# Patient Record
Sex: Female | Born: 1990
Health system: Southern US, Community
[De-identification: ages and names within clinical notes are randomized; demographics above are authoritative.]

## PROBLEM LIST (undated history)

## (undated) ENCOUNTER — Inpatient Hospital Stay (HOSPITAL_COMMUNITY): Payer: Self-pay

## (undated) DIAGNOSIS — Z789 Other specified health status: Secondary | ICD-10-CM

## (undated) HISTORY — PX: INDUCED ABORTION: SHX677

---
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
## 2010-01-16 ENCOUNTER — Emergency Department (HOSPITAL_COMMUNITY): Admission: EM | Admit: 2010-01-16 | Discharge: 2010-01-16 | Payer: Self-pay | Admitting: Emergency Medicine

## 2010-11-15 ENCOUNTER — Emergency Department (HOSPITAL_COMMUNITY)
Admission: EM | Admit: 2010-11-15 | Discharge: 2010-11-16 | Payer: Self-pay | Source: Home / Self Care | Admitting: Emergency Medicine

## 2010-11-30 ENCOUNTER — Emergency Department (HOSPITAL_COMMUNITY)
Admission: EM | Admit: 2010-11-30 | Discharge: 2010-11-30 | Payer: Self-pay | Source: Home / Self Care | Admitting: Emergency Medicine

## 2011-01-31 LAB — URINALYSIS, ROUTINE W REFLEX MICROSCOPIC
Nitrite: NEGATIVE
Specific Gravity, Urine: 1.012 (ref 1.005–1.030)
Urobilinogen, UA: 0.2 mg/dL (ref 0.0–1.0)
pH: 7.5 (ref 5.0–8.0)

## 2011-01-31 LAB — GC/CHLAMYDIA PROBE AMP, GENITAL
Chlamydia, DNA Probe: POSITIVE — AB
GC Probe Amp, Genital: NEGATIVE

## 2011-01-31 LAB — URINE MICROSCOPIC-ADD ON

## 2011-01-31 LAB — POCT I-STAT, CHEM 8
BUN: 13 mg/dL (ref 6–23)
Chloride: 103 mEq/L (ref 96–112)
HCT: 40 % (ref 36.0–46.0)
Sodium: 137 mEq/L (ref 135–145)
TCO2: 28 mmol/L (ref 0–100)

## 2011-01-31 LAB — WET PREP, GENITAL
Clue Cells Wet Prep HPF POC: NONE SEEN
Trich, Wet Prep: NONE SEEN

## 2011-01-31 LAB — URINE CULTURE

## 2011-01-31 LAB — TSH: TSH: 0.466 u[IU]/mL (ref 0.350–4.500)

## 2011-02-12 LAB — URINALYSIS, ROUTINE W REFLEX MICROSCOPIC
Glucose, UA: NEGATIVE mg/dL
Ketones, ur: NEGATIVE mg/dL
Nitrite: POSITIVE — AB
Specific Gravity, Urine: 1.017 (ref 1.005–1.030)
pH: 7 (ref 5.0–8.0)

## 2011-02-12 LAB — URINE MICROSCOPIC-ADD ON

## 2011-02-12 LAB — URINE CULTURE

## 2011-03-30 ENCOUNTER — Emergency Department (HOSPITAL_COMMUNITY)
Admission: EM | Admit: 2011-03-30 | Discharge: 2011-03-30 | Disposition: A | Discharge status: Home / Self Care | Payer: Self-pay | Attending: Emergency Medicine | Admitting: Emergency Medicine

## 2011-03-30 DIAGNOSIS — R197 Diarrhea, unspecified: Secondary | ICD-10-CM | POA: Insufficient documentation

## 2011-03-30 DIAGNOSIS — R112 Nausea with vomiting, unspecified: Secondary | ICD-10-CM | POA: Insufficient documentation

## 2011-03-30 LAB — URINALYSIS, ROUTINE W REFLEX MICROSCOPIC
Bilirubin Urine: NEGATIVE
Nitrite: NEGATIVE
Urobilinogen, UA: 1 mg/dL (ref 0.0–1.0)

## 2011-03-31 LAB — URINE CULTURE: Culture  Setup Time: 201205230113

## 2011-08-10 LAB — COMPREHENSIVE METABOLIC PANEL
ALT: 12
Albumin: 3.8
Alkaline Phosphatase: 80
BUN: 7
Chloride: 104
Glucose, Bld: 91
Potassium: 3.8
Sodium: 138
Total Bilirubin: 0.4
Total Protein: 7

## 2011-08-10 LAB — GC/CHLAMYDIA PROBE AMP, URINE
Chlamydia, Swab/Urine, PCR: POSITIVE — AB
GC Probe Amp, Urine: NEGATIVE

## 2011-08-10 LAB — URINE CULTURE

## 2011-08-10 LAB — TYPE AND SCREEN

## 2011-08-10 LAB — ABO/RH: ABO/RH(D): B POS

## 2011-08-10 LAB — DIFFERENTIAL
Basophils Absolute: 0
Basophils Relative: 1
Eosinophils Absolute: 0
Monocytes Absolute: 0.5
Monocytes Relative: 9
Neutrophils Relative %: 72 — ABNORMAL HIGH

## 2011-08-10 LAB — URINALYSIS, ROUTINE W REFLEX MICROSCOPIC
Glucose, UA: NEGATIVE
Nitrite: NEGATIVE
pH: 6

## 2011-08-10 LAB — CBC
HCT: 44
Hemoglobin: 14.8
RDW: 11.8
WBC: 5.1

## 2011-08-10 LAB — POCT RAPID STREP A: Streptococcus, Group A Screen (Direct): NEGATIVE

## 2011-08-10 LAB — WET PREP, GENITAL: WBC, Wet Prep HPF POC: NONE SEEN

## 2011-08-10 LAB — RAPID STREP SCREEN (MED CTR MEBANE ONLY): Streptococcus, Group A Screen (Direct): NEGATIVE

## 2011-08-10 LAB — GC/CHLAMYDIA PROBE AMP, GENITAL: Chlamydia, DNA Probe: POSITIVE — AB

## 2011-08-13 LAB — DIFFERENTIAL
Basophils Absolute: 0
Basophils Relative: 0
Eosinophils Absolute: 0
Eosinophils Relative: 0
Lymphocytes Relative: 7 — ABNORMAL LOW
Lymphs Abs: 0.6 — ABNORMAL LOW
Monocytes Absolute: 0 — ABNORMAL LOW
Monocytes Relative: 0 — ABNORMAL LOW
Neutro Abs: 8
Neutrophils Relative %: 93 — ABNORMAL HIGH

## 2011-08-13 LAB — COMPREHENSIVE METABOLIC PANEL WITH GFR
ALT: 14
AST: 26
CO2: 24
Chloride: 108
Creatinine, Ser: 0.84
Total Bilirubin: 0.9

## 2011-08-13 LAB — COMPREHENSIVE METABOLIC PANEL
Albumin: 4.3
Alkaline Phosphatase: 67
BUN: 9
Calcium: 9.7
Glucose, Bld: 101 — ABNORMAL HIGH
Potassium: 3.5
Sodium: 141
Total Protein: 8

## 2011-08-13 LAB — CBC
HCT: 40.7
Hemoglobin: 14.3
MCHC: 35.1
MCV: 86.9
Platelets: 315
RBC: 4.69
RDW: 12.5
WBC: 8.6

## 2011-08-13 LAB — PREGNANCY, URINE: Preg Test, Ur: NEGATIVE

## 2011-08-13 LAB — URINALYSIS, ROUTINE W REFLEX MICROSCOPIC
Bilirubin Urine: NEGATIVE
Glucose, UA: NEGATIVE
Hgb urine dipstick: NEGATIVE
Ketones, ur: 40 — AB
Leukocytes, UA: NEGATIVE
Nitrite: NEGATIVE
Protein, ur: 30 — AB
Specific Gravity, Urine: 1.034 — ABNORMAL HIGH
Urobilinogen, UA: 0.2
pH: 7.5

## 2011-08-13 LAB — LIPASE, BLOOD: Lipase: 70 — ABNORMAL HIGH

## 2011-08-13 LAB — URINE MICROSCOPIC-ADD ON

## 2011-11-18 ENCOUNTER — Encounter (HOSPITAL_COMMUNITY): Payer: Self-pay | Admitting: Emergency Medicine

## 2011-11-18 ENCOUNTER — Inpatient Hospital Stay (HOSPITAL_COMMUNITY)
Admission: AD | Admit: 2011-11-18 | Discharge: 2011-11-18 | Disposition: A | Discharge status: Home / Self Care | Payer: Medicaid Other | Source: Ambulatory Visit | Attending: Obstetrics & Gynecology | Admitting: Obstetrics & Gynecology

## 2011-11-18 ENCOUNTER — Encounter (HOSPITAL_COMMUNITY): Payer: Self-pay | Admitting: *Deleted

## 2011-11-18 ENCOUNTER — Inpatient Hospital Stay (HOSPITAL_COMMUNITY): Payer: Medicaid Other

## 2011-11-18 ENCOUNTER — Emergency Department (INDEPENDENT_AMBULATORY_CARE_PROVIDER_SITE_OTHER)
Admission: EM | Admit: 2011-11-18 | Discharge: 2011-11-18 | Disposition: A | Discharge status: Home / Self Care | Payer: Medicaid Other | Source: Home / Self Care | Attending: Family Medicine | Admitting: Family Medicine

## 2011-11-18 DIAGNOSIS — B009 Herpesviral infection, unspecified: Secondary | ICD-10-CM

## 2011-11-18 DIAGNOSIS — R109 Unspecified abdominal pain: Secondary | ICD-10-CM

## 2011-11-18 DIAGNOSIS — A609 Anogenital herpesviral infection, unspecified: Secondary | ICD-10-CM

## 2011-11-18 DIAGNOSIS — A6000 Herpesviral infection of urogenital system, unspecified: Secondary | ICD-10-CM | POA: Insufficient documentation

## 2011-11-18 HISTORY — DX: Other specified health status: Z78.9

## 2011-11-18 LAB — POCT URINALYSIS DIP (DEVICE)
Glucose, UA: NEGATIVE mg/dL
Ketones, ur: 15 mg/dL — AB
Specific Gravity, Urine: >=1.03 (ref 1.005–1.030)
pH: 6 (ref 5.0–8.0)

## 2011-11-18 LAB — CBC
HCT: 37.5 % (ref 36.0–46.0)
Hemoglobin: 13.2 g/dL (ref 12.0–15.0)
MCH: 31.8 pg (ref 26.0–34.0)
MCV: 90.4 fL (ref 78.0–100.0)
Platelets: 237 10*3/uL (ref 150–400)
RBC: 4.15 MIL/uL (ref 3.87–5.11)
WBC: 7.1 10*3/uL (ref 4.0–10.5)

## 2011-11-18 LAB — WET PREP, GENITAL

## 2011-11-18 MED ORDER — ACYCLOVIR 400 MG PO TABS: 400.0000 mg | ORAL_TABLET | Freq: Three times a day (TID) | ORAL | Status: AC

## 2011-11-18 MED ORDER — ACYCLOVIR 400 MG PO TABS: 400.0000 mg | ORAL_TABLET | Freq: Two times a day (BID) | ORAL | Status: AC

## 2011-11-18 NOTE — Progress Notes (Signed)
PT SAYS  SHE WAS PREG  FROM OCT- DEC-  HAD TAB  ON 10-27-2011-  [facility] IN [place].  2-3 DAYS AFTER- WAS WALKING IN MALL IN [place]- SOMETHING CAME OUT- SHE LOOKED ON COMPUTER- SAYS FETUS-   SHE SAW EYES AND THINKS HEAD.   AFTER- CALLED [facility]   FOLLOW-UP IS DUE 11-19-2010- BUT PLANS TO GO HERE AT [facility].- SHE LIVES HERE.   TODAY AT 4PM WENT TO URGENT CARE- COLLECT URINE- SAYS PREG.  - TOLD HER TO COME HERE. HAS PAD ON NOW- BROWN D/C-- PT   SAYS IT HAS ODOR.- STARTED Sunday.

## 2011-11-18 NOTE — Progress Notes (Signed)
LABS DRAWN

## 2011-11-18 NOTE — ED Provider Notes (Signed)
History     CSN: [id number]  Arrival date & time 11/18/11  1639   First MD Initiated Contact with Patient 11/18/11 1843      Chief Complaint  Patient presents with  . Abdominal Cramping    (Consider location/radiation/quality/duration/timing/severity/associated sxs/prior treatment) Patient is a 21 y.o. female presenting with cramps. The history is provided by the patient. No language interpreter was used.  Abdominal Cramping The primary symptoms of the illness include abdominal pain. The current episode started more than 2 days ago. The onset of the illness was gradual. The problem has been gradually worsening.  The patient states that she believes she is currently pregnant. Additional symptoms associated with the illness include back pain. Significant associated medical issues do not include diabetes.  Pt had an abortion on 12/19.  Pt reports she has continued to have increasing pain.  Pt is worried that she has infection.  History reviewed. No pertinent past medical history.  History reviewed. No pertinent past surgical history.  No family history on file.  History  Substance Use Topics  . Smoking status: Current Everyday Smoker -- 0.5 packs/day    Types: Cigarettes  . Smokeless tobacco: Not on file  . Alcohol Use: No    OB History    Grav Para Term Preterm Abortions TAB SAB Ect Mult Living                  Review of Systems  Gastrointestinal: Positive for abdominal pain.  Musculoskeletal: Positive for back pain.  All other systems reviewed and are negative.    Allergies  Review of patient's allergies indicates no known allergies.  Home Medications  No current outpatient prescriptions on file.  BP 118/60  Pulse 70  Temp(Src) 98.6 F (37 C) (Oral)  Resp 16  SpO2 100%  LMP 11/18/2011  Physical Exam  Vitals reviewed. Constitutional: She is oriented to person, place, and time. She appears well-developed and well-nourished.  HENT:  Head: Normocephalic  and atraumatic.  Right Ear: External ear normal.  Left Ear: External ear normal.  Nose: Nose normal.  Mouth/Throat: Oropharynx is clear and moist.  Eyes: Conjunctivae are normal. Pupils are equal, round, and reactive to light.  Neck: Normal range of motion. Neck supple.  Cardiovascular: Normal rate.   Abdominal: Soft.  Musculoskeletal: Normal range of motion.  Neurological: She is alert and oriented to person, place, and time.  Skin: Skin is warm.  Psychiatric: She has a normal mood and affect.    ED Course  Procedures (including critical care time)  Labs Reviewed  POCT URINALYSIS DIP (DEVICE) - Abnormal; Notable for the following:    Bilirubin Urine SMALL (*)    Ketones, ur 15 (*)    Hgb urine dipstick MODERATE (*)    Protein, ur 30 (*)    Leukocytes, UA TRACE (*) Biochemical Testing Only. Please order routine urinalysis from main lab if confirmatory testing is needed.   All other components within normal limits  POCT PREGNANCY, URINE  POCT URINALYSIS DIPSTICK  POCT PREGNANCY, URINE   No results found.   No diagnosis found.    MDM  Preg test is positive.   I counseled pt.  [facility]  Pt needs Quant and Ultrasound.        Langston Masker, [place] 11/18/11 1907

## 2011-11-18 NOTE — ED Notes (Signed)
Pt c/o severe cramps constantly since her abortion on 12/19. She says she passed something several days after the procedure, but the pain has not gotten any better. The pain is 10/10 and unrelenting. Pt also would like STD and HIV testing.

## 2011-11-18 NOTE — Progress Notes (Signed)
TOOK IBUPROFEN 4OO MG AT 4PM

## 2011-11-18 NOTE — ED Provider Notes (Signed)
History     No chief complaint on file.  HPI 21 y.o. G1P0010 s/p TAB on 12/19 at [facility] in [place]. Ongoing abdominal pain since about 1 week after TAB, no bleeding now, only brown d/c, + odor. Requesting testing for infections. Was seen at Urgent Care today, told to come here for u/s and bloodwork. Also c/o irritation in vaginal area - thinks it may be from wearing a pad.    Past Medical History  Diagnosis Date  . No pertinent past medical history     Past Surgical History  Procedure Date  . Induced abortion     Family History  Problem Relation Age of Onset  . Cancer Mother   . Hypertension Father     History  Substance Use Topics  . Smoking status: Current Everyday Smoker -- 0.5 packs/day    Types: Cigarettes  . Smokeless tobacco: Not on file  . Alcohol Use: No    Allergies: No Known Allergies  No prescriptions prior to admission    Review of Systems  Constitutional: Negative.   Respiratory: Negative.   Cardiovascular: Negative.   Gastrointestinal: Positive for abdominal pain. Negative for nausea, vomiting, diarrhea and constipation.  Genitourinary: Negative for dysuria, urgency, frequency, hematuria and flank pain.       Positive for discharge and irritation   Musculoskeletal: Negative.   Neurological: Negative.   Psychiatric/Behavioral: Negative.    Physical Exam   Blood pressure 114/57, pulse 84, temperature 99.3 F (37.4 C), resp. rate 20, height 5\' 3"  (1.6 m), weight 112 lb 2 oz (50.86 kg), last menstrual period 11/18/2011.  Physical Exam  Nursing note and vitals reviewed. Constitutional: She is oriented to person, place, and time. She appears well-developed and well-nourished. No distress.  HENT:  Head: Normocephalic and atraumatic.  Cardiovascular: Normal rate, regular rhythm and normal heart sounds.   Respiratory: Effort normal and breath sounds normal. No respiratory distress.  GI: Soft. Bowel sounds are normal. She exhibits no  distension and no mass. There is no tenderness. There is no rebound and no guarding.  Genitourinary:    There is lesion on the right labia. There is no rash on the right labia. There is lesion on the left labia. There is no rash on the left labia. Uterus is not deviated, not enlarged, not fixed and not tender. Cervix exhibits no motion tenderness, no discharge and no friability. Right adnexum displays no mass, no tenderness and no fullness. Left adnexum displays no mass, no tenderness and no fullness. No erythema, tenderness or bleeding around the vagina. Vaginal discharge (white) found.  Lymphadenopathy:       Right: Inguinal adenopathy present.       Left: Inguinal adenopathy present.  Neurological: She is alert and oriented to person, place, and time.  Skin: Skin is warm and dry.  Psychiatric: She has a normal mood and affect.    MAU Course  Procedures  Results for orders placed during the hospital encounter of 11/18/11 (from the past 24 hour(s))  CBC     Status: Normal   Collection Time   11/18/11  8:30 PM      Component Value Range   WBC 7.1  4.0 - 10.5 (K/uL)   RBC 4.15  3.87 - 5.11 (MIL/uL)   Hemoglobin 13.2  12.0 - 15.0 (g/dL)   HCT 16.1  09.6 - 04.5 (%)   MCV 90.4  78.0 - 100.0 (fL)   MCH 31.8  26.0 - 34.0 (pg)   MCHC 35.2  30.0 - 36.0 (g/dL)   RDW 16.1  09.6 - 04.5 (%)   Platelets 237  150 - 400 (K/uL)  ABO/RH     Status: Normal   Collection Time   11/18/11  8:30 PM      Component Value Range   ABO/RH(D) B POS    HCG, QUANTITATIVE, PREGNANCY     Status: Abnormal   Collection Time   11/18/11  8:30 PM      Component Value Range   hCG, Beta Chain, Quant, S 59 (*) <5 (mIU/mL)  WET PREP, GENITAL     Status: Abnormal   Collection Time   11/18/11 10:30 PM      Component Value Range   Yeast, Wet Prep NONE SEEN  NONE SEEN    Trich, Wet Prep NONE SEEN  NONE SEEN    Clue Cells, Wet Prep NONE SEEN  NONE SEEN    WBC, Wet Prep HPF POC FEW (*) NONE SEEN    US Ob Comp Less 14  Wks  11/18/2011  *RADIOLOGY REPORT*  Clinical Data: Pelvic pain.  Patient status post surgical abortion on 10/27/2011.  OBSTETRIC <14 WK ULTRASOUND  Technique:  Transabdominal ultrasound was performed for evaluation of the gestation as well as the maternal uterus and adnexal regions.  Comparison:  None.  Intrauterine gestational sac: None Yolk sac: None Embryo: Not present  Maternal uterus/Adnexae: The endometrium is indistinct through the uterine fundus. Myometrium of the uterine fundus is heterogeneous.  The left and right ovary appear normal.  No free fluid.  IMPRESSION:   Indistinct endometrium and heterogeneous myometrium within the uterine fundus following abortion.  Differential includes postsurgical change versus hematoma versus retained products of conception.  Original Report Authenticated By: Genevive Bi, M.D.   US Ob Transvaginal  11/18/2011  *RADIOLOGY REPORT*  Clinical Data: Pelvic pain.  Patient status post surgical abortion on 10/27/2011.  OBSTETRIC <14 WK ULTRASOUND  Technique:  Transabdominal ultrasound was performed for evaluation of the gestation as well as the maternal uterus and adnexal regions.  Comparison:  None.  Intrauterine gestational sac: None Yolk sac: None Embryo: Not present  Maternal uterus/Adnexae: The endometrium is indistinct through the uterine fundus. Myometrium of the uterine fundus is heterogeneous.  The left and right ovary appear normal.  No free fluid.  IMPRESSION:   Indistinct endometrium and heterogeneous myometrium within the uterine fundus following abortion.  Differential includes postsurgical change versus hematoma versus retained products of conception.  Original Report Authenticated By: Genevive Bi, M.D.   HSV and GC/CT pending Assessment and Plan  21 y.o. G1P0010 s/p TAB with abdominal pain - f/u in 48 hours for repeat quant Likely HSV outbreak - rx Acyclovir 400 tid x 7 days, then 400 bid for prophylaxis if desired/needed [facility] as [facility] - may have further STD testing there  Keylin Ferryman 11/18/2011, 9:33 PM

## 2011-11-19 LAB — HERPES SIMPLEX VIRUS(HSV) DNA BY PCR
HSV 2 DNA: DETECTED
Specimen source hsv: NO GROWTH

## 2011-11-19 NOTE — ED Provider Notes (Signed)
Medical screening examination/treatment/procedure(s) were performed by non-physician practitioner and as supervising physician I was immediately available for consultation/collaboration.  Corrie Mckusick, MD 11/19/11 1016

## 2011-11-21 ENCOUNTER — Inpatient Hospital Stay (HOSPITAL_COMMUNITY)
Admission: AD | Admit: 2011-11-21 | Discharge: 2011-11-21 | Disposition: A | Discharge status: Home / Self Care | Payer: Medicaid Other | Source: Ambulatory Visit | Attending: Obstetrics and Gynecology | Admitting: Obstetrics and Gynecology

## 2011-11-21 DIAGNOSIS — Z09 Encounter for follow-up examination after completed treatment for conditions other than malignant neoplasm: Secondary | ICD-10-CM | POA: Insufficient documentation

## 2011-11-21 LAB — HCG, QUANTITATIVE, PREGNANCY: hCG, Beta Chain, Quant, S: 33 m[IU]/mL — ABNORMAL HIGH (ref <5)

## 2011-11-21 NOTE — ED Provider Notes (Signed)
Chief Complaint:  Labs Only   Ashley Stout is  21 y.o. G1P0010.  Patient's last menstrual period was 11/18/2011.Marland Kitchen  Her pregnancy status is unknown.  She presents complaining of Labs Only She was seen in MAU 2 days ago, following a TAB and had a QHCG of 56 and u/s showed possible retained products.  Bleeding is minimal now. She was also newly dx with HSV2 with + culture OB History    Grav Para Term Preterm Abortions TAB SAB Ect Mult Living   1    1 1            Past Medical History  Diagnosis Date  . No pertinent past medical history     Past Surgical History  Procedure Date  . Induced abortion     Family History  Problem Relation Age of Onset  . Cancer Mother   . Hypertension Father     History  Substance Use Topics  . Smoking status: Current Everyday Smoker -- 0.5 packs/day    Types: Cigarettes  . Smokeless tobacco: Not on file  . Alcohol Use: No    Allergies: No Known Allergies  Prescriptions prior to admission  Medication Sig Dispense Refill  . acyclovir (ZOVIRAX) 400 MG tablet Take 1 tablet (400 mg total) by mouth 3 (three) times daily.  21 tablet  0  . acyclovir (ZOVIRAX) 400 MG tablet Take 1 tablet (400 mg total) by mouth 2 (two) times daily.  60 tablet  11  . OVER THE COUNTER MEDICATION Take 1 tablet by mouth daily. Vitafusion Prenatal Gummy        Review of Systems - Negative except aforementioned complaints  Physical Exam  Not needed Last menstrual period 11/18/2011.  Labs: No results found for this or any previous visit (from the past 24 hour(s)). Imaging Studies:  US Ob Comp Less 14 Wks  11/18/2011  *RADIOLOGY REPORT*  Clinical Data: Pelvic pain.  Patient status post surgical abortion on 10/27/2011.  OBSTETRIC <14 WK ULTRASOUND  Technique:  Transabdominal ultrasound was performed for evaluation of the gestation as well as the maternal uterus and adnexal regions.  Comparison:  None.  Intrauterine gestational sac: None Yolk sac: None Embryo: Not present   Maternal uterus/Adnexae: The endometrium is indistinct through the uterine fundus. Myometrium of the uterine fundus is heterogeneous.  The left and right ovary appear normal.  No free fluid.  IMPRESSION:   Indistinct endometrium and heterogeneous myometrium within the uterine fundus following abortion.  Differential includes postsurgical change versus hematoma versus retained products of conception.  Original Report Authenticated By: Genevive Bi, M.D.   US Ob Transvaginal  11/18/2011  *RADIOLOGY REPORT*  Clinical Data: Pelvic pain.  Patient status post surgical abortion on 10/27/2011.  OBSTETRIC <14 WK ULTRASOUND  Technique:  Transabdominal ultrasound was performed for evaluation of the gestation as well as the maternal uterus and adnexal regions.  Comparison:  None.  Intrauterine gestational sac: None Yolk sac: None Embryo: Not present  Maternal uterus/Adnexae: The endometrium is indistinct through the uterine fundus. Myometrium of the uterine fundus is heterogeneous.  The left and right ovary appear normal.  No free fluid.  IMPRESSION:   Indistinct endometrium and heterogeneous myometrium within the uterine fundus following abortion.  Differential includes postsurgical change versus hematoma versus retained products of conception.  Original Report Authenticated By: Genevive Bi, M.D.     Assessment: Will repeat qhcg.  Machine is not working here so labs are being sent to [facility].    Plan: Call pt  with results  CRESENZO-DISHMAN,Tavia Stave

## 2011-11-21 NOTE — ED Notes (Signed)
Ashley Stout,CNM discussing HSV dx with pt. Pt upset educating pt on treatment and prevention. Pt to go home and she will call her with results of BHCG. Pt agreeable to plan.

## 2011-11-21 NOTE — Progress Notes (Signed)
Pt had TAB and quant was 56. Here for  Repeat BHCG. Pt having some cramping that is getting better.

## 2011-11-21 NOTE — ED Notes (Signed)
Attempted to reach pt by phone to inform her that she does not need to f/u any longer in MAU (per [place], CNM).  Left message on number listed in chart, called mother's home who told me the pt's cell is [phone], not the number we have listed.  No answer at that number.

## 2011-11-22 ENCOUNTER — Telehealth (HOSPITAL_COMMUNITY): Payer: Self-pay | Admitting: Nurse Practitioner

## 2011-11-22 NOTE — Telephone Encounter (Signed)
Patient had TAB 10/27/11. Went to Urgent Care 11/19/11 for vaginal pain and dx with HSV, also had pos pregnancy test at that time. [facility] and Bhcg was 59. Returned 12/09/11 and the Bhcg ha dropped to 33. Patient instructed to return in 48 hours. Call today stating that she feels fine and since the numbers are dropping she will follow up with her PCP at [facility]. Discussed with patient if she has heavy bleeding, pain, feeling weak and dizzy or other problems to return immediately. Patient voices understanding.

## 2011-11-22 NOTE — ED Provider Notes (Signed)
Agree with above note.  Ashley Nickson H. 11/22/2011 12:32 PM  

## 2011-11-24 NOTE — ED Provider Notes (Signed)
Attestation of Attending Supervision of Advanced Practitioner: Evaluation and management procedures were performed by the PA/NP/CNM/OB Fellow under my supervision/collaboration. Chart reviewed and agree with management and plan.  Delvina Mizzell V 11/24/2011 11:57 AM

## 2013-01-12 ENCOUNTER — Emergency Department (HOSPITAL_COMMUNITY)
Admission: EM | Admit: 2013-01-12 | Discharge: 2013-01-12 | Disposition: A | Discharge status: Home / Self Care | Payer: BC Managed Care – PPO | Attending: Emergency Medicine | Admitting: Emergency Medicine

## 2013-01-12 ENCOUNTER — Encounter (HOSPITAL_COMMUNITY): Payer: Self-pay | Admitting: Emergency Medicine

## 2013-01-12 DIAGNOSIS — Z3202 Encounter for pregnancy test, result negative: Secondary | ICD-10-CM | POA: Insufficient documentation

## 2013-01-12 DIAGNOSIS — R634 Abnormal weight loss: Secondary | ICD-10-CM | POA: Insufficient documentation

## 2013-01-12 DIAGNOSIS — M2550 Pain in unspecified joint: Secondary | ICD-10-CM | POA: Insufficient documentation

## 2013-01-12 DIAGNOSIS — F17200 Nicotine dependence, unspecified, uncomplicated: Secondary | ICD-10-CM | POA: Insufficient documentation

## 2013-01-12 DIAGNOSIS — R112 Nausea with vomiting, unspecified: Secondary | ICD-10-CM

## 2013-01-12 LAB — CBC WITH DIFFERENTIAL/PLATELET
Basophils Absolute: 0 10*3/uL (ref 0.0–0.1)
Basophils Relative: 1 % (ref 0–1)
Eosinophils Absolute: 0 10*3/uL (ref 0.0–0.7)
MCH: 29 pg (ref 26.0–34.0)
MCHC: 34.6 g/dL (ref 30.0–36.0)
Monocytes Absolute: 0.3 10*3/uL (ref 0.1–1.0)
Neutro Abs: 2.9 10*3/uL (ref 1.7–7.7)
Neutrophils Relative %: 64 % (ref 43–77)
RDW: 13.6 % (ref 11.5–15.5)

## 2013-01-12 LAB — COMPREHENSIVE METABOLIC PANEL
AST: 15 U/L (ref 0–37)
Albumin: 4.2 g/dL (ref 3.5–5.2)
Chloride: 103 mEq/L (ref 96–112)
Creatinine, Ser: 0.82 mg/dL (ref 0.50–1.10)
Potassium: 3.7 mEq/L (ref 3.5–5.1)
Total Bilirubin: 0.5 mg/dL (ref 0.3–1.2)
Total Protein: 8.1 g/dL (ref 6.0–8.3)

## 2013-01-12 LAB — URINE MICROSCOPIC-ADD ON

## 2013-01-12 LAB — URINALYSIS, ROUTINE W REFLEX MICROSCOPIC
Glucose, UA: NEGATIVE mg/dL
Leukocytes, UA: NEGATIVE
Nitrite: NEGATIVE
Specific Gravity, Urine: 1.016 (ref 1.005–1.030)
pH: 6 (ref 5.0–8.0)

## 2013-01-12 LAB — PREGNANCY, URINE: Preg Test, Ur: NEGATIVE

## 2013-01-12 MED ORDER — ONDANSETRON HCL 4 MG/2ML IJ SOLN
4.0000 mg | Freq: Once | INTRAMUSCULAR | Status: AC
  Administered 2013-01-12: 4 mg via INTRAVENOUS
  Filled 2013-01-12: qty 2

## 2013-01-12 MED ORDER — SODIUM CHLORIDE 0.9 % IV BOLUS (SEPSIS)
1000.0000 mL | Freq: Once | INTRAVENOUS | Status: AC
  Administered 2013-01-12: 1000 mL via INTRAVENOUS

## 2013-01-12 MED ORDER — ACETAMINOPHEN 325 MG PO TABS: 650.0000 mg | ORAL_TABLET | Freq: Once | ORAL | Status: DC

## 2013-01-12 NOTE — ED Provider Notes (Signed)
History     CSN: [id number]  Arrival date & time 01/12/13  1619   First MD Initiated Contact with Patient 01/12/13 1623      Chief Complaint  Patient presents with  . Emesis  . Joint Pain    (Consider location/radiation/quality/duration/timing/severity/associated sxs/prior treatment) HPI Patient with nausea for two months worse for past week with vomiting.  States has lost 20 pounds.  Joints are sore, sleeping less.  Patient denies chest pain, cough, abdominal pain, masses, dark tarry stools, history of anemia. Past Medical History  Diagnosis Date  . No pertinent past medical history     Past Surgical History  Procedure Laterality Date  . Induced abortion      Family History  Problem Relation Age of Onset  . Cancer Mother   . Hypertension Father     History  Substance Use Topics  . Smoking status: Current Every Day Smoker -- 0.50 packs/day    Types: Cigarettes  . Smokeless tobacco: Not on file  . Alcohol Use: No    OB History   Grav Para Term Preterm Abortions TAB SAB Ect Mult Living   1    1 1           Review of Systems  All other systems reviewed and are negative.    Allergies  Review of patient's allergies indicates no known allergies.  Home Medications   Current Outpatient Rx  Name  Route  Sig  Dispense  Refill  . OVER THE COUNTER MEDICATION   Oral   Take 1 tablet by mouth daily. Vitafusion Prenatal Gummy           BP 110/64  Pulse 102  Temp(Src) 98.4 F (36.9 C) (Oral)  Resp 22  SpO2 97%  Physical Exam  Nursing note and vitals reviewed. Constitutional: She appears well-developed and well-nourished.  HENT:  Head: Normocephalic and atraumatic.  Slightly tender over thyroid  Eyes: Conjunctivae and EOM are normal. Pupils are equal, round, and reactive to light.  Neck: Normal range of motion. Neck supple.  Cardiovascular: Normal rate, regular rhythm, normal heart sounds and intact distal pulses.   Pulmonary/Chest: Effort normal and  breath sounds normal.  Abdominal: Soft. Bowel sounds are normal.  Musculoskeletal: Normal range of motion.  Neurological: She is alert.  Skin: Skin is warm and dry.  Psychiatric: She has a normal mood and affect. Thought content normal.    ED Course  Procedures (including critical care time)  Labs Reviewed - No data to display No results found.   No diagnosis found.    MDM   Results for orders placed during the hospital encounter of 01/12/13  CBC WITH DIFFERENTIAL      Result Value Range   WBC 4.5  4.0 - 10.5 K/uL   RBC 4.86  3.87 - 5.11 MIL/uL   Hemoglobin 14.1  12.0 - 15.0 g/dL   HCT 40.9  81.1 - 91.4 %   MCV 84.0  78.0 - 100.0 fL   MCH 29.0  26.0 - 34.0 pg   MCHC 34.6  30.0 - 36.0 g/dL   RDW 78.2  95.6 - 21.3 %   Platelets 287  150 - 400 K/uL   Neutrophils Relative 64  43 - 77 %   Neutro Abs 2.9  1.7 - 7.7 K/uL   Lymphocytes Relative 27  12 - 46 %   Lymphs Abs 1.2  0.7 - 4.0 K/uL   Monocytes Relative 7  3 - 12 %   Monocytes  Absolute 0.3  0.1 - 1.0 K/uL   Eosinophils Relative 1  0 - 5 %   Eosinophils Absolute 0.0  0.0 - 0.7 K/uL   Basophils Relative 1  0 - 1 %   Basophils Absolute 0.0  0.0 - 0.1 K/uL  COMPREHENSIVE METABOLIC PANEL      Result Value Range   Sodium 139  135 - 145 mEq/L   Potassium 3.7  3.5 - 5.1 mEq/L   Chloride 103  96 - 112 mEq/L   CO2 24  19 - 32 mEq/L   Glucose, Bld 86  70 - 99 mg/dL   BUN 8  6 - 23 mg/dL   Creatinine, Ser 1.47  0.50 - 1.10 mg/dL   Calcium 9.3  8.4 - 82.9 mg/dL   Total Protein 8.1  6.0 - 8.3 g/dL   Albumin 4.2  3.5 - 5.2 g/dL   AST 15  0 - 37 U/L   ALT 9  0 - 35 U/L   Alkaline Phosphatase 62  39 - 117 U/L   Total Bilirubin 0.5  0.3 - 1.2 mg/dL   GFR calc non Af Amer >90  >90 mL/min   GFR calc Af Amer >90  >90 mL/min  PREGNANCY, URINE      Result Value Range   Preg Test, Ur NEGATIVE  NEGATIVE  URINALYSIS, ROUTINE W REFLEX MICROSCOPIC      Result Value Range   Color, Urine YELLOW  YELLOW   APPearance CLEAR  CLEAR    Specific Gravity, Urine 1.016  1.005 - 1.030   pH 6.0  5.0 - 8.0   Glucose, UA NEGATIVE  NEGATIVE mg/dL   Hgb urine dipstick TRACE (*) NEGATIVE   Bilirubin Urine NEGATIVE  NEGATIVE   Ketones, ur TRACE (*) NEGATIVE mg/dL   Protein, ur 30 (*) NEGATIVE mg/dL   Urobilinogen, UA 0.2  0.0 - 1.0 mg/dL   Nitrite NEGATIVE  NEGATIVE   Leukocytes, UA NEGATIVE  NEGATIVE  URINE MICROSCOPIC-ADD ON      Result Value Range   Squamous Epithelial / LPF RARE  RARE   WBC, UA 0-2  <3 WBC/hpf   RBC / HPF 0-2  <3 RBC/hpf   Patient with normal vital signs here and CBC and chemistries are normal. Thyroid screening exam was done but will not be returned tonight. Patient is advised that she should the flow manager for results. She is advised she should follow with her primary care this week.        Hilario Quarry, MD 01/12/13 [phone]

## 2013-01-12 NOTE — ED Notes (Signed)
Pt states that she has lost over 20lb in past few months.  Pt states she has been nauseous, but in past week has had emesis after every time she ate.  Pt states she has also has had diarrhea and has been sweaty.  Pt states period has also been early for 2 months.  States period started today.  Pt also states she has sore joints.  Denies fever, SOB.

## 2013-01-12 NOTE — ED Notes (Signed)
Notified pt that urine is needed

## 2013-01-15 LAB — T3: T3, Total: 73 ng/dl — ABNORMAL LOW (ref 80.0–204.0)

## 2013-01-15 LAB — TSH: TSH: 0.55 u[IU]/mL (ref 0.350–4.500)

## 2013-01-15 LAB — T4, FREE: Free T4: 1.47 ng/dL (ref 0.80–1.80)

## 2013-02-20 ENCOUNTER — Emergency Department (HOSPITAL_COMMUNITY)
Admission: EM | Admit: 2013-02-20 | Discharge: 2013-02-20 | Disposition: A | Discharge status: Home / Self Care | Payer: BC Managed Care – PPO | Attending: Emergency Medicine | Admitting: Emergency Medicine

## 2013-02-20 ENCOUNTER — Encounter (HOSPITAL_COMMUNITY): Payer: Self-pay | Admitting: Emergency Medicine

## 2013-02-20 DIAGNOSIS — B9689 Other specified bacterial agents as the cause of diseases classified elsewhere: Secondary | ICD-10-CM | POA: Insufficient documentation

## 2013-02-20 DIAGNOSIS — Z8742 Personal history of other diseases of the female genital tract: Secondary | ICD-10-CM | POA: Insufficient documentation

## 2013-02-20 DIAGNOSIS — N898 Other specified noninflammatory disorders of vagina: Secondary | ICD-10-CM | POA: Insufficient documentation

## 2013-02-20 DIAGNOSIS — F17200 Nicotine dependence, unspecified, uncomplicated: Secondary | ICD-10-CM | POA: Insufficient documentation

## 2013-02-20 DIAGNOSIS — Z3202 Encounter for pregnancy test, result negative: Secondary | ICD-10-CM | POA: Insufficient documentation

## 2013-02-20 DIAGNOSIS — N760 Acute vaginitis: Secondary | ICD-10-CM | POA: Insufficient documentation

## 2013-02-20 DIAGNOSIS — A499 Bacterial infection, unspecified: Secondary | ICD-10-CM | POA: Insufficient documentation

## 2013-02-20 DIAGNOSIS — Z8619 Personal history of other infectious and parasitic diseases: Secondary | ICD-10-CM | POA: Insufficient documentation

## 2013-02-20 DIAGNOSIS — N9489 Other specified conditions associated with female genital organs and menstrual cycle: Secondary | ICD-10-CM | POA: Insufficient documentation

## 2013-02-20 LAB — WET PREP, GENITAL: WBC, Wet Prep HPF POC: NONE SEEN

## 2013-02-20 LAB — URINALYSIS, ROUTINE W REFLEX MICROSCOPIC
Ketones, ur: NEGATIVE mg/dL
Leukocytes, UA: NEGATIVE
Nitrite: NEGATIVE
Protein, ur: 100 mg/dL — AB
Urobilinogen, UA: 0.2 mg/dL (ref 0.0–1.0)

## 2013-02-20 MED ORDER — METRONIDAZOLE 500 MG PO TABS: 500.0000 mg | ORAL_TABLET | Freq: Two times a day (BID) | ORAL | Status: DC

## 2013-02-20 NOTE — ED Provider Notes (Signed)
History     CSN: [id number]  Arrival date & time 02/20/13  1903   First MD Initiated Contact with Patient 02/20/13 1910      Chief Complaint  Patient presents with  . Exposure to STD    (Consider location/radiation/quality/duration/timing/severity/associated sxs/prior treatment) Patient is a 22 y.o. female presenting with STD exposure. The history is provided by the patient and medical records. No language interpreter was used.  Exposure to STD The current episode started more than 1 month ago. The problem occurs constantly. The problem has been gradually worsening. Pertinent negatives include no abdominal pain, arthralgias, change in bowel habit, chest pain, chills, congestion, coughing, diaphoresis, fatigue, fever, headaches, myalgias, nausea, neck pain, rash, sore throat, swollen glands, urinary symptoms, vertigo, visual change, vomiting or weakness. Nothing aggravates the symptoms. She has tried nothing for the symptoms. The treatment provided no relief.    Ashley Stout is a 22 y.o. female  with a hx of induced abortion presents to the Emergency Department complaining of gradual, persistent, progressively worsening vaginal discharge onset 2 months ago. Associated symptoms include vaginal odor.  Nothing makes it better and nothing makes it worse.  Pt denies fever, chills, headache, neck pain, chest pain, SOB, abdominal/pelvic pain, N/V/D weakness, syncope, dizziness, urinary frequency, urgency or dysuria.  Pt denies using douce or other feminine washes.  Pt is sexually active without consistent use of condom or other birth control with 1 female partner for the past year.  Pt has a Hx of chlamydia.  LMP 02/12/13 and ended several days ago.      Past Medical History  Diagnosis Date  . No pertinent past medical history     Past Surgical History  Procedure Laterality Date  . Induced abortion      Family History  Problem Relation Age of Onset  . Cancer Mother   . Hypertension Father      History  Substance Use Topics  . Smoking status: Current Every Day Smoker -- 0.50 packs/day    Types: Cigarettes  . Smokeless tobacco: Not on file  . Alcohol Use: Yes    OB History   Grav Para Term Preterm Abortions TAB SAB Ect Mult Living   1    1 1           Review of Systems  Constitutional: Negative for fever, chills, diaphoresis, appetite change, fatigue and unexpected weight change.  HENT: Negative for congestion, sore throat, neck pain and neck stiffness.   Respiratory: Negative for cough, chest tightness and shortness of breath.   Cardiovascular: Negative for chest pain.  Gastrointestinal: Negative for nausea, vomiting, abdominal pain, diarrhea, constipation, blood in stool, abdominal distention, rectal pain and change in bowel habit.  Genitourinary: Positive for vaginal discharge. Negative for dysuria, urgency, frequency, hematuria, flank pain, vaginal bleeding, difficulty urinating, vaginal pain, menstrual problem and pelvic pain.  Musculoskeletal: Negative for myalgias, back pain and arthralgias.  Skin: Negative for rash.  Neurological: Negative for dizziness, vertigo, weakness, light-headedness and headaches.  All other systems reviewed and are negative.    Allergies  Acetaminophen  Home Medications   Current Outpatient Rx  Name  Route  Sig  Dispense  Refill  . metroNIDAZOLE (FLAGYL) 500 MG tablet   Oral   Take 1 tablet (500 mg total) by mouth 2 (two) times daily. One po bid x 7 days   14 tablet   0     BP 91/63  Pulse 74  Temp(Src) 99.1 F (37.3 C) (Oral)  SpO2 100%  LMP 02/13/2013  Physical Exam  Nursing note and vitals reviewed. Constitutional: She is oriented to person, place, and time. She appears well-developed and well-nourished. No distress.  HENT:  Head: Normocephalic and atraumatic.  Mouth/Throat: Oropharynx is clear and moist.  Eyes: Conjunctivae are normal. Pupils are equal, round, and reactive to light. No scleral icterus.  Neck:  Normal range of motion. Neck supple.  Cardiovascular: Normal rate, regular rhythm, normal heart sounds and intact distal pulses.   No murmur heard. Pulmonary/Chest: Effort normal and breath sounds normal. No respiratory distress. She has no wheezes.  Abdominal: Soft. Normal appearance and bowel sounds are normal. She exhibits no mass. There is no hepatosplenomegaly. There is no tenderness. There is no rigidity, no rebound, no guarding, no CVA tenderness, no tenderness at McBurney's point and negative Murphy's sign. Hernia confirmed negative in the right inguinal area and confirmed negative in the left inguinal area.  Genitourinary: Uterus normal. Pelvic exam was performed with patient supine. There is no rash, tenderness or lesion on the right labia. There is no rash, tenderness or lesion on the left labia. Uterus is not deviated, not enlarged, not fixed and not tender. Cervix exhibits no motion tenderness, no discharge and no friability. Right adnexum displays no mass, no tenderness and no fullness. Left adnexum displays no mass, no tenderness and no fullness. No erythema, tenderness or bleeding around the vagina. No foreign body around the vagina. No signs of injury around the vagina. Vaginal discharge found.  Musculoskeletal: Normal range of motion. She exhibits no edema.  Lymphadenopathy:    She has no cervical adenopathy.       Right: No inguinal adenopathy present.       Left: No inguinal adenopathy present.  Neurological: She is alert and oriented to person, place, and time. She exhibits normal muscle tone. Coordination normal.  Speech is clear and goal oriented Moves extremities without ataxia  Skin: Skin is warm and dry. No rash noted. She is not diaphoretic.  Psychiatric: She has a normal mood and affect.    ED Course  Procedures (including critical care time)  Labs Reviewed  WET PREP, GENITAL - Abnormal; Notable for the following:    Clue Cells Wet Prep HPF POC FEW (*)    All other  components within normal limits  URINALYSIS, ROUTINE W REFLEX MICROSCOPIC - Abnormal; Notable for the following:    APPearance CLOUDY (*)    Protein, ur 100 (*)    All other components within normal limits  URINE MICROSCOPIC-ADD ON - Abnormal; Notable for the following:    Squamous Epithelial / LPF FEW (*)    Bacteria, UA FEW (*)    All other components within normal limits  GC/CHLAMYDIA PROBE AMP  POCT PREGNANCY, URINE   No results found.   1. BV (bacterial vaginosis)   2. Vaginal discharge   3. Vaginal odor       MDM  Ashley Stout presents for concerns of STD exposure, vaginal discharge and vaginal odor.  Patient to be discharged with instructions to follow up with OBGYN. Discussed importance of using protection when sexually active. Pt understands that they have GC/Chlamydia cultures pending and that they will need to inform all sexual partners if results return positive.  Pt not concerning for PID because hemodynamically stable and no cervical motion tenderness on pelvic exam. Pt has been treated with flagyl for Bacterial Vaginosis. Pt has been advised to not drink alcohol while on this medication. I have also discussed reasons to return  immediately to the ER.  Patient expresses understanding and agrees with plan.           Dahlia Client Shakiera Edelson, PA-C 02/20/13 2123

## 2013-02-20 NOTE — ED Notes (Signed)
Pt wants check for STD due to more vaginal discharge and different (foul)odor than usually.

## 2013-02-21 LAB — GC/CHLAMYDIA PROBE AMP
CT Probe RNA: NEGATIVE
GC Probe RNA: NEGATIVE

## 2013-02-21 NOTE — ED Provider Notes (Signed)
Medical screening examination/treatment/procedure(s) were performed by non-physician practitioner and as supervising physician I was immediately available for consultation/collaboration.   Nelia Shi, MD 02/21/13 1031

## 2013-02-24 ENCOUNTER — Telehealth (HOSPITAL_COMMUNITY): Payer: Self-pay | Admitting: Emergency Medicine

## 2013-03-19 ENCOUNTER — Emergency Department (HOSPITAL_COMMUNITY)
Admission: EM | Admit: 2013-03-19 | Discharge: 2013-03-19 | Disposition: A | Discharge status: Home / Self Care | Payer: BC Managed Care – PPO | Attending: Emergency Medicine | Admitting: Emergency Medicine

## 2013-03-19 ENCOUNTER — Emergency Department (HOSPITAL_COMMUNITY): Payer: BC Managed Care – PPO

## 2013-03-19 DIAGNOSIS — Z79899 Other long term (current) drug therapy: Secondary | ICD-10-CM | POA: Insufficient documentation

## 2013-03-19 DIAGNOSIS — R05 Cough: Secondary | ICD-10-CM

## 2013-03-19 DIAGNOSIS — R059 Cough, unspecified: Secondary | ICD-10-CM | POA: Insufficient documentation

## 2013-03-19 DIAGNOSIS — Z3202 Encounter for pregnancy test, result negative: Secondary | ICD-10-CM | POA: Insufficient documentation

## 2013-03-19 DIAGNOSIS — F17200 Nicotine dependence, unspecified, uncomplicated: Secondary | ICD-10-CM | POA: Insufficient documentation

## 2013-03-19 DIAGNOSIS — R5381 Other malaise: Secondary | ICD-10-CM | POA: Insufficient documentation

## 2013-03-19 DIAGNOSIS — R634 Abnormal weight loss: Secondary | ICD-10-CM | POA: Insufficient documentation

## 2013-03-19 DIAGNOSIS — R5383 Other fatigue: Secondary | ICD-10-CM | POA: Insufficient documentation

## 2013-03-19 LAB — URINALYSIS, ROUTINE W REFLEX MICROSCOPIC
Bilirubin Urine: NEGATIVE
Glucose, UA: NEGATIVE mg/dL
Ketones, ur: NEGATIVE mg/dL
Leukocytes, UA: NEGATIVE
Protein, ur: 30 mg/dL — AB

## 2013-03-19 LAB — URINE MICROSCOPIC-ADD ON

## 2013-03-19 LAB — CBC WITH DIFFERENTIAL/PLATELET
Basophils Absolute: 0 10*3/uL (ref 0.0–0.1)
Basophils Relative: 1 % (ref 0–1)
Eosinophils Relative: 3 % (ref 0–5)
HCT: 34.6 % — ABNORMAL LOW (ref 36.0–46.0)
Hemoglobin: 12 g/dL (ref 12.0–15.0)
Lymphocytes Relative: 24 % (ref 12–46)
MCHC: 34.7 g/dL (ref 30.0–36.0)
MCV: 84.6 fL (ref 78.0–100.0)
Monocytes Absolute: 0.9 10*3/uL (ref 0.1–1.0)
Monocytes Relative: 15 % — ABNORMAL HIGH (ref 3–12)
RDW: 13.9 % (ref 11.5–15.5)

## 2013-03-19 LAB — BASIC METABOLIC PANEL
BUN: 12 mg/dL (ref 6–23)
CO2: 26 mEq/L (ref 19–32)
Calcium: 9 mg/dL (ref 8.4–10.5)
Creatinine, Ser: 1.04 mg/dL (ref 0.50–1.10)

## 2013-03-19 NOTE — ED Notes (Signed)
Sheldon MD at bedside. 

## 2013-03-19 NOTE — ED Provider Notes (Signed)
History     CSN: [id number]  Arrival date & time 03/19/13  0141   First MD Initiated Contact with Patient 03/19/13 0359      Chief Complaint  Patient presents with  . Weight Loss    (Consider location/radiation/quality/duration/timing/severity/associated sxs/prior treatment) HPI Pt with no known medical problems reports 25lb weight loss for the last several months. Seen in the ED for same about 2 months ago and had negative workup including thyroid function but has not seen her PCP since then. She reports continued general weakness and weight loss. Denies any anorexia, N/V/D, dysuria or fever. She reports some nasal congestion and cough the last few days. Cough is mostly dry, no SOB. No fever.   Past Medical History  Diagnosis Date  . No pertinent past medical history     Past Surgical History  Procedure Laterality Date  . Induced abortion      Family History  Problem Relation Age of Onset  . Cancer Mother   . Hypertension Father     History  Substance Use Topics  . Smoking status: Current Every Day Smoker -- 0.50 packs/day    Types: Cigarettes  . Smokeless tobacco: Not on file  . Alcohol Use: Yes    OB History   Grav Para Term Preterm Abortions TAB SAB Ect Mult Living   1    1 1           Review of Systems All other systems reviewed and are negative except as noted in HPI.   Allergies  Acetaminophen  Home Medications   Current Outpatient Rx  Name  Route  Sig  Dispense  Refill  . Multiple Vitamin (MULTIVITAMIN WITH MINERALS) TABS   Oral   Take 1 tablet by mouth daily.           BP 116/73  Pulse 86  Temp(Src) 98.6 F (37 C) (Oral)  Resp 18  SpO2 100%  LMP 02/13/2013  Physical Exam  Nursing note and vitals reviewed. Constitutional: She is oriented to person, place, and time. She appears well-developed.  Thin  HENT:  Head: Normocephalic and atraumatic.  Eyes: EOM are normal. Pupils are equal, round, and reactive to light.  Neck: Normal  range of motion. Neck supple.  Cardiovascular: Normal rate, normal heart sounds and intact distal pulses.   Pulmonary/Chest: Effort normal and breath sounds normal.  Abdominal: Bowel sounds are normal. She exhibits no distension. There is no tenderness.  Musculoskeletal: Normal range of motion. She exhibits no edema and no tenderness.  Neurological: She is alert and oriented to person, place, and time. She has normal strength. No cranial nerve deficit or sensory deficit.  Skin: Skin is warm and dry. No rash noted.  Psychiatric: She has a normal mood and affect.    ED Course  Procedures (including critical care time)  Labs Reviewed  CBC WITH DIFFERENTIAL - Abnormal; Notable for the following:    HCT 34.6 (*)    Monocytes Relative 15 (*)    All other components within normal limits  BASIC METABOLIC PANEL - Abnormal; Notable for the following:    Potassium 3.3 (*)    Glucose, Bld 105 (*)    GFR calc non Af Amer 76 (*)    GFR calc Af Amer 88 (*)    All other components within normal limits  URINALYSIS, ROUTINE W REFLEX MICROSCOPIC - Abnormal; Notable for the following:    Protein, ur 30 (*)    All other components within normal limits  PREGNANCY, URINE  URINE MICROSCOPIC-ADD ON   Dg Chest 2 View  03/19/2013  *RADIOLOGY REPORT*  Clinical Data: Cough, shortness of breath  CHEST - 2 VIEW  Comparison: 08/10/2006  Findings: Lungs are clear. No pleural effusion or pneumothorax. The cardiomediastinal contours are within normal limits. The visualized bones and soft tissues are without significant appreciable abnormality.  IMPRESSION: No radiographic evidence of acute cardiopulmonary process.   Original Report Authenticated By: Jearld Lesch, M.D.      1. Unexplained weight loss   2. Cough       MDM  Pt with normal labs here, sleeping comfortably in the ED. No concerning findings on CXR. Advised to follow up with PCP for further evaluation of her unexplained weight loss.          Charles B. Bernette Mayers, MD 03/19/13 [phone]

## 2013-03-19 NOTE — ED Notes (Signed)
MD at bedside. 

## 2013-03-19 NOTE — ED Notes (Signed)
Pt states she has unexplained weight since December. Currently has lost over 25 lbs. Denies dieting and exercise. Pt feels fatigued.

## 2013-06-16 ENCOUNTER — Emergency Department (HOSPITAL_COMMUNITY)
Admission: EM | Admit: 2013-06-16 | Discharge: 2013-06-16 | Disposition: A | Discharge status: Home / Self Care | Payer: Self-pay | Attending: Emergency Medicine | Admitting: Emergency Medicine

## 2013-06-16 ENCOUNTER — Encounter (HOSPITAL_COMMUNITY): Payer: Self-pay | Admitting: *Deleted

## 2013-06-16 DIAGNOSIS — N760 Acute vaginitis: Secondary | ICD-10-CM

## 2013-06-16 DIAGNOSIS — B9689 Other specified bacterial agents as the cause of diseases classified elsewhere: Secondary | ICD-10-CM | POA: Insufficient documentation

## 2013-06-16 DIAGNOSIS — Z3202 Encounter for pregnancy test, result negative: Secondary | ICD-10-CM | POA: Insufficient documentation

## 2013-06-16 DIAGNOSIS — N898 Other specified noninflammatory disorders of vagina: Secondary | ICD-10-CM | POA: Insufficient documentation

## 2013-06-16 DIAGNOSIS — F17200 Nicotine dependence, unspecified, uncomplicated: Secondary | ICD-10-CM | POA: Insufficient documentation

## 2013-06-16 DIAGNOSIS — Z79899 Other long term (current) drug therapy: Secondary | ICD-10-CM | POA: Insufficient documentation

## 2013-06-16 DIAGNOSIS — R110 Nausea: Secondary | ICD-10-CM | POA: Insufficient documentation

## 2013-06-16 DIAGNOSIS — A499 Bacterial infection, unspecified: Secondary | ICD-10-CM | POA: Insufficient documentation

## 2013-06-16 LAB — URINALYSIS, ROUTINE W REFLEX MICROSCOPIC
Bilirubin Urine: NEGATIVE
Glucose, UA: NEGATIVE mg/dL
Hgb urine dipstick: NEGATIVE
Ketones, ur: NEGATIVE mg/dL
Protein, ur: 30 mg/dL — AB
Urobilinogen, UA: 1 mg/dL (ref 0.0–1.0)

## 2013-06-16 LAB — URINE MICROSCOPIC-ADD ON

## 2013-06-16 LAB — WET PREP, GENITAL
Trich, Wet Prep: NONE SEEN
Yeast Wet Prep HPF POC: NONE SEEN

## 2013-06-16 MED ORDER — METRONIDAZOLE 500 MG PO TABS: 500.0000 mg | ORAL_TABLET | Freq: Two times a day (BID) | ORAL | Status: DC
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
## 2013-06-16 NOTE — ED Provider Notes (Signed)
CSN: [id number]     Arrival date & time 06/16/13  2118 History     First MD Initiated Contact with Patient 06/16/13 2134     Chief Complaint  Patient presents with  . Exposure to STD   (Consider location/radiation/quality/duration/timing/severity/associated sxs/prior Treatment) Patient is a 22 y.o. female presenting with STD exposure. The history is provided by the patient.  Exposure to STD   patient here complaining of vaginal discharge with -- shoulder as well 2. Some nausea but no diarrhea. No fever or chills. No dysuria or hematuria. History of similar symptoms in the past associated with bacterial vaginosis. Symptoms have been persistent nothing makes them better worse. No treatment done prior to arrival  Past Medical History  Diagnosis Date  . No pertinent past medical history    Past Surgical History  Procedure Laterality Date  . Induced abortion     Family History  Problem Relation Age of Onset  . Cancer Mother   . Hypertension Father    History  Substance Use Topics  . Smoking status: Current Every Day Smoker -- 0.50 packs/day    Types: Cigarettes  . Smokeless tobacco: Not on file  . Alcohol Use: Yes   OB History   Grav Para Term Preterm Abortions TAB SAB Ect Mult Living   1    1 1          Review of Systems  All other systems reviewed and are negative.    Allergies  Acetaminophen  Home Medications   Current Outpatient Rx  Name  Route  Sig  Dispense  Refill  . Multiple Vitamin (MULTIVITAMIN WITH MINERALS) TABS   Oral   Take 1 tablet by mouth daily.          BP 124/81  Pulse 87  Temp(Src) 98.8 F (37.1 C) (Oral)  Resp 18  Ht 5\' 4"  (1.626 m)  Wt 110 lb (49.896 kg)  BMI 18.87 kg/m2  SpO2 99%  LMP 06/06/2013 Physical Exam  Nursing note and vitals reviewed. Constitutional: She is oriented to person, place, and time. She appears well-developed and well-nourished.  Non-toxic appearance. No distress.  HENT:  Head: Normocephalic and atraumatic.   Eyes: Conjunctivae, EOM and lids are normal. Pupils are equal, round, and reactive to light.  Neck: Normal range of motion. Neck supple. No tracheal deviation present. No mass present.  Cardiovascular: Normal rate, regular rhythm and normal heart sounds.  Exam reveals no gallop.   No murmur heard. Pulmonary/Chest: Effort normal and breath sounds normal. No stridor. No respiratory distress. She has no decreased breath sounds. She has no wheezes. She has no rhonchi. She has no rales.  Abdominal: Soft. Normal appearance and bowel sounds are normal. She exhibits no distension. There is no tenderness. There is no rebound and no CVA tenderness.  Genitourinary: No tenderness around the vagina. No vaginal discharge found.  Musculoskeletal: Normal range of motion. She exhibits no edema and no tenderness.  Neurological: She is alert and oriented to person, place, and time. She has normal strength. No cranial nerve deficit or sensory deficit. GCS eye subscore is 4. GCS verbal subscore is 5. GCS motor subscore is 6.  Skin: Skin is warm and dry. No abrasion and no rash noted.  Psychiatric: She has a normal mood and affect. Her speech is normal and behavior is normal.    ED Course   Procedures (including critical care time)  Labs Reviewed - No data to display No results found. No diagnosis found.  MDM  Pt to be tx from BV  Toy Baker, MD 06/16/13 2213

## 2013-10-20 ENCOUNTER — Emergency Department (HOSPITAL_COMMUNITY): Payer: Self-pay

## 2013-10-20 ENCOUNTER — Encounter (HOSPITAL_COMMUNITY): Payer: Self-pay | Admitting: Emergency Medicine

## 2013-10-20 ENCOUNTER — Emergency Department (HOSPITAL_COMMUNITY)
Admission: EM | Admit: 2013-10-20 | Discharge: 2013-10-21 | Disposition: A | Discharge status: Home / Self Care | Payer: Self-pay | Attending: Emergency Medicine | Admitting: Emergency Medicine

## 2013-10-20 DIAGNOSIS — O2390 Unspecified genitourinary tract infection in pregnancy, unspecified trimester: Secondary | ICD-10-CM | POA: Insufficient documentation

## 2013-10-20 DIAGNOSIS — J159 Unspecified bacterial pneumonia: Secondary | ICD-10-CM | POA: Insufficient documentation

## 2013-10-20 DIAGNOSIS — O99330 Smoking (tobacco) complicating pregnancy, unspecified trimester: Secondary | ICD-10-CM | POA: Insufficient documentation

## 2013-10-20 DIAGNOSIS — J189 Pneumonia, unspecified organism: Secondary | ICD-10-CM

## 2013-10-20 DIAGNOSIS — R040 Epistaxis: Secondary | ICD-10-CM | POA: Insufficient documentation

## 2013-10-20 DIAGNOSIS — N390 Urinary tract infection, site not specified: Secondary | ICD-10-CM | POA: Insufficient documentation

## 2013-10-20 DIAGNOSIS — Z3491 Encounter for supervision of normal pregnancy, unspecified, first trimester: Secondary | ICD-10-CM

## 2013-10-20 DIAGNOSIS — O9989 Other specified diseases and conditions complicating pregnancy, childbirth and the puerperium: Secondary | ICD-10-CM | POA: Insufficient documentation

## 2013-10-20 LAB — URINALYSIS, ROUTINE W REFLEX MICROSCOPIC
Hgb urine dipstick: NEGATIVE
Nitrite: POSITIVE — AB
Protein, ur: NEGATIVE mg/dL
Specific Gravity, Urine: 1.007 (ref 1.005–1.030)
Urobilinogen, UA: 0.2 mg/dL (ref 0.0–1.0)

## 2013-10-20 LAB — URINE MICROSCOPIC-ADD ON

## 2013-10-20 LAB — HCG, QUANTITATIVE, PREGNANCY: hCG, Beta Chain, Quant, S: 41406 m[IU]/mL — ABNORMAL HIGH (ref <5)

## 2013-10-20 MED ORDER — AZITHROMYCIN 250 MG PO TABS
500.0000 mg | ORAL_TABLET | Freq: Once | ORAL | Status: AC
  Administered 2013-10-20: 500 mg via ORAL
  Filled 2013-10-20: qty 2

## 2013-10-20 MED ORDER — AZITHROMYCIN 250 MG PO TABS: 250.0000 mg | ORAL_TABLET | Freq: Every day | ORAL | Status: AC

## 2013-10-20 MED ORDER — NITROFURANTOIN MONOHYD MACRO 100 MG PO CAPS: 100.0000 mg | ORAL_CAPSULE | Freq: Two times a day (BID) | ORAL | Status: DC

## 2013-10-20 MED ORDER — ONDANSETRON 4 MG PO TBDP
4.0000 mg | ORAL_TABLET | Freq: Once | ORAL | Status: AC
  Administered 2013-10-20: 4 mg via ORAL
  Filled 2013-10-20: qty 1

## 2013-10-20 NOTE — ED Provider Notes (Addendum)
CSN: [id number]     Arrival date & time 10/20/13  1813 History   First MD Initiated Contact with Patient 10/20/13 1912     Chief Complaint  Patient presents with  . Cough  . Chills   (Consider location/radiation/quality/duration/timing/severity/associated sxs/prior Treatment) HPI Comments: 22 year old female presents with 2 complaints. First complaint is a week and a half of a cough is mildly productive of yellow sputum. She states she's not short of breath but the amount of coffee she's had it started to cause her chest wall to hurt. She's also felt febrile and had chills. She denies hemoptysis. She has had a little nasal rhinorrhea as well as some brief epistaxis. Her brother was recently incarcerated and was treated for tuberculosis. She states he never had cough or sputum but she is concerned that her cough is now tuberculosis. She also found out last week that she is pregnant.this is her first pregnancy. She also saw her primary care physician who did an ultrasound was unable to see the pregnancy. For the past week she's been having intermittent lower abdominal cramping. She denies any vaginal bleeding. She states she been having normal vaginal discharge. Denies any urinary symptoms. Denies any vomiting.    Past Medical History  Diagnosis Date  . No pertinent past medical history    Past Surgical History  Procedure Laterality Date  . Induced abortion     Family History  Problem Relation Age of Onset  . Cancer Mother   . Hypertension Father    History  Substance Use Topics  . Smoking status: Current Every Day Smoker -- 0.50 packs/day    Types: Cigarettes  . Smokeless tobacco: Not on file  . Alcohol Use: Yes   OB History   Grav Para Term Preterm Abortions TAB SAB Ect Mult Living   1    1 1          Review of Systems  Constitutional: Positive for fever and chills.  Respiratory: Positive for cough. Negative for shortness of breath.   Cardiovascular: Positive for chest pain.   Gastrointestinal: Positive for abdominal pain. Negative for vomiting.  Genitourinary: Negative for dysuria, hematuria and vaginal bleeding.  All other systems reviewed and are negative.    Allergies  Acetaminophen  Home Medications  No current outpatient prescriptions on file. BP 121/80  Pulse 100  Temp(Src) 99.3 F (37.4 C) (Oral)  Resp 18  SpO2 100%  LMP 08/17/2013 Physical Exam  Nursing note and vitals reviewed. Constitutional: She is oriented to person, place, and time. She appears well-developed and well-nourished. No distress.  HENT:  Head: Normocephalic and atraumatic.  Right Ear: External ear normal.  Left Ear: External ear normal.  Nose: Nose normal.  Mouth/Throat: Oropharynx is clear and moist.  Neck: Neck supple.  Cardiovascular: Normal rate, regular rhythm, normal heart sounds and intact distal pulses.   Pulmonary/Chest: Effort normal and breath sounds normal. She has no wheezes. She has no rales. She exhibits tenderness.  Abdominal: Soft. She exhibits no distension. There is tenderness in the suprapubic area.  Musculoskeletal: She exhibits no edema.  Lymphadenopathy:    She has no cervical adenopathy.  Neurological: She is alert and oriented to person, place, and time.  Skin: Skin is warm and dry. No pallor.    ED Course  Procedures (including critical care time) Labs Review Labs Reviewed  HCG, QUANTITATIVE, PREGNANCY - Abnormal; Notable for the following:    hCG, Beta Chain, Quant, S 41406 (*)    All other components  within normal limits  URINALYSIS, ROUTINE W REFLEX MICROSCOPIC - Abnormal; Notable for the following:    APPearance CLOUDY (*)    Nitrite POSITIVE (*)    Leukocytes, UA TRACE (*)    All other components within normal limits  PREGNANCY, URINE - Abnormal; Notable for the following:    Preg Test, Ur POSITIVE (*)    All other components within normal limits  URINE MICROSCOPIC-ADD ON - Abnormal; Notable for the following:    Bacteria, UA  MANY (*)    All other components within normal limits  GC/CHLAMYDIA PROBE AMP  WET PREP, GENITAL  URINE CULTURE   Imaging Review Dg Chest 2 View  10/20/2013   CLINICAL DATA:  Cough and chills for 1 week.  EXAM: CHEST  2 VIEW  COMPARISON:  Chest radiograph performed 03/19/2013  FINDINGS: The lungs are well-aerated. Focal left lower lobe pneumonia is noted. There is no evidence of pleural effusion or pneumothorax.  The heart is normal in size; the mediastinal contour is within normal limits. No acute osseous abnormalities are seen.  IMPRESSION: Focal left lower lobe pneumonia noted.   Electronically Signed   By: Roanna Raider M.D.   On: 10/20/2013 21:24   US Ob Comp Less 14 Wks  10/20/2013   CLINICAL DATA:  Pelvic cramping, fever and chills.  EXAM: OBSTETRIC <14 WK [place] AND TRANSVAGINAL OB US  TECHNIQUE: Both transabdominal and transvaginal ultrasound examinations were performed for complete evaluation of the gestation as well as the maternal uterus, adnexal regions, and pelvic cul-de-sac. Transvaginal technique was performed to assess early pregnancy.  COMPARISON:  Pelvic ultrasound performed 08/18/2012  FINDINGS: Intrauterine gestational sac: Visualized/normal in shape.  Yolk sac:  Yes  Embryo:  Yes  Cardiac Activity: Yes  Heart Rate: 131 bpm  CRL:   8.3  mm   6 w 6 d                  [place] EDC: 06/21/2014  Maternal uterus/adnexae: No subchorionic hemorrhage is noted. The uterus is unremarkable in appearance.  The ovaries are within normal limits. The right ovary measures 2.3 x 0.8 x 2.2 cm, while the left ovary measures 4.2 x 2.0 x 3.1 cm. No suspicious adnexal masses are seen; there is no evidence for ovarian torsion.  No free fluid is seen within the pelvic cul-de-sac.  IMPRESSION: Single live intrauterine pregnancy noted, with a crown-rump length of 0.8 cm, corresponding to a gestational age of [redacted] weeks 6 days. This does not match the gestational age by LMP, and reflects a new estimated date of delivery  of June 11, 2014.   Electronically Signed   By: Roanna Raider M.D.   On: 10/20/2013 22:19   US Ob Transvaginal  10/20/2013   CLINICAL DATA:  Pelvic cramping, fever and chills.  EXAM: OBSTETRIC <14 WK [place] AND TRANSVAGINAL OB US  TECHNIQUE: Both transabdominal and transvaginal ultrasound examinations were performed for complete evaluation of the gestation as well as the maternal uterus, adnexal regions, and pelvic cul-de-sac. Transvaginal technique was performed to assess early pregnancy.  COMPARISON:  Pelvic ultrasound performed 08/18/2012  FINDINGS: Intrauterine gestational sac: Visualized/normal in shape.  Yolk sac:  Yes  Embryo:  Yes  Cardiac Activity: Yes  Heart Rate: 131 bpm  CRL:   8.3  mm   6 w 6 d                  [place] EDC: 06/21/2014  Maternal uterus/adnexae: No subchorionic hemorrhage is  noted. The uterus is unremarkable in appearance.  The ovaries are within normal limits. The right ovary measures 2.3 x 0.8 x 2.2 cm, while the left ovary measures 4.2 x 2.0 x 3.1 cm. No suspicious adnexal masses are seen; there is no evidence for ovarian torsion.  No free fluid is seen within the pelvic cul-de-sac.  IMPRESSION: Single live intrauterine pregnancy noted, with a crown-rump length of 0.8 cm, corresponding to a gestational age of [redacted] weeks 6 days. This does not match the gestational age by LMP, and reflects a new estimated date of delivery of June 11, 2014.   Electronically Signed   By: Roanna Raider M.D.   On: 10/20/2013 22:19    EKG Interpretation    Date/Time:  Saturday October 20 2013 21:57:37 EST Ventricular Rate:  100 PR Interval:  188 QRS Duration: 89 QT Interval:  340 QTC Calculation: 438 R Axis:   78 Text Interpretation:  Sinus tachycardia Probable left atrial enlargement No old tracing to compare Confirmed by Abbye Lao  MD, Evelisse Szalkowski ([id number]) on 10/20/2013 10:17:29 PM            MDM   1. Community acquired pneumonia   2. First trimester pregnancy   3. UTI (urinary tract  infection)    Patient is refusing a repeat pelvic exam. States she had one which is in the ED at [facility] 2 weeks ago. Review the records there show a negative gonorrhea, Chlamydia, Trichomonas, and back tear vaginosis testing. Ultrasound performed there showed a 4 week fetus but no fetal pole and recommended close followup. I discussed that with her continued symptoms and discharge OB recommended to get a repeat pelvic exam to make sure she also does not have any of these above infections. Patient is refusing and understands the risk that she could be having infection that could threaten her pregnancy. She does have a UTI, will treat with macrobid. She appears to have LLL PNA on CXR, is a CAP. Patient appears well, no hypoxia or tachypnea, will treat with zpack. Discussed strict return precautions. Her fetus is IUP, appears viable. Will recommend prenatal vitamins (will get OTC) and ASAP OB/GYN f/u.     Audree Camel, MD 10/20/13 2340  Addendum: Patient vomited after eating some pickles, well after getting her antibiotics. She was given zofran with improvement and feels stable to go home. I cautioned her on returning if she gets fevers, worse vomiting or other concerning symptoms.  Audree Camel, MD 10/21/13 0001

## 2013-10-20 NOTE — ED Notes (Signed)
Pt refused Pelvic Exam. Per pt she had is done in [place] a week ago. Criss Alvine is aware

## 2013-10-20 NOTE — ED Notes (Signed)
Pt ambulatory to exam room with steady gait.  

## 2013-10-20 NOTE — ED Notes (Signed)
Pt states that for past week and a half she has had cough, "sick feeling", chills.  States she just found out she's pregnant.  LMP October 10.

## 2013-10-20 NOTE — ED Notes (Signed)
Attempted x3 to call pt.  Pt. Never responded. Nurse was notified.

## 2013-10-23 LAB — URINE CULTURE

## 2014-01-07 ENCOUNTER — Encounter (HOSPITAL_COMMUNITY): Payer: Self-pay | Admitting: *Deleted

## 2014-01-07 ENCOUNTER — Inpatient Hospital Stay (HOSPITAL_COMMUNITY)
Admission: AD | Admit: 2014-01-07 | Discharge: 2014-01-07 | Disposition: A | Discharge status: Home / Self Care | Payer: Medicaid Other | Source: Ambulatory Visit | Attending: Obstetrics & Gynecology | Admitting: Obstetrics & Gynecology

## 2014-01-07 DIAGNOSIS — O99330 Smoking (tobacco) complicating pregnancy, unspecified trimester: Secondary | ICD-10-CM | POA: Insufficient documentation

## 2014-01-07 DIAGNOSIS — N898 Other specified noninflammatory disorders of vagina: Secondary | ICD-10-CM | POA: Insufficient documentation

## 2014-01-07 DIAGNOSIS — R109 Unspecified abdominal pain: Secondary | ICD-10-CM | POA: Insufficient documentation

## 2014-01-07 DIAGNOSIS — O99891 Other specified diseases and conditions complicating pregnancy: Secondary | ICD-10-CM | POA: Insufficient documentation

## 2014-01-07 DIAGNOSIS — N949 Unspecified condition associated with female genital organs and menstrual cycle: Secondary | ICD-10-CM

## 2014-01-07 DIAGNOSIS — O9989 Other specified diseases and conditions complicating pregnancy, childbirth and the puerperium: Principal | ICD-10-CM

## 2014-01-07 LAB — URINALYSIS, ROUTINE W REFLEX MICROSCOPIC
BILIRUBIN URINE: NEGATIVE
GLUCOSE, UA: NEGATIVE mg/dL
HGB URINE DIPSTICK: NEGATIVE
Ketones, ur: NEGATIVE mg/dL
Leukocytes, UA: NEGATIVE
Nitrite: NEGATIVE
PROTEIN: NEGATIVE mg/dL
Specific Gravity, Urine: 1.02 (ref 1.005–1.030)
Urobilinogen, UA: 0.2 mg/dL (ref 0.0–1.0)
pH: 7 (ref 5.0–8.0)

## 2014-01-07 LAB — RAPID URINE DRUG SCREEN, HOSP PERFORMED
Amphetamines: NOT DETECTED
Barbiturates: NOT DETECTED
Benzodiazepines: NOT DETECTED
Cocaine: NOT DETECTED
Opiates: NOT DETECTED
Tetrahydrocannabinol: POSITIVE — AB

## 2014-01-07 NOTE — Discharge Instructions (Signed)
Second Trimester of Pregnancy The second trimester is from week 13 through week 28, months 4 through 6. The second trimester is often a time when you feel your best. Your body has also adjusted to being pregnant, and you begin to feel better physically. Usually, morning sickness has lessened or quit completely, you may have more energy, and you may have an increase in appetite. The second trimester is also a time when the fetus is growing rapidly. At the end of the sixth month, the fetus is about 9 inches long and weighs about 1 pounds. You will likely begin to feel the baby move (quickening) between 18 and 20 weeks of the pregnancy. BODY CHANGES Your body goes through many changes during pregnancy. The changes vary from woman to woman.   Your weight will continue to increase. You will notice your lower abdomen bulging out.  You may begin to get stretch marks on your hips, abdomen, and breasts.  You may develop headaches that can be relieved by medicines approved by your caregiver.  You may urinate more often because the fetus is pressing on your bladder.  You may develop or continue to have heartburn as a result of your pregnancy.  You may develop constipation because certain hormones are causing the muscles that push waste through your intestines to slow down.  You may develop hemorrhoids or swollen, bulging veins (varicose veins).  You may have back pain because of the weight gain and pregnancy hormones relaxing your joints between the bones in your pelvis and as a result of a shift in weight and the muscles that support your balance.  Your breasts will continue to grow and be tender.  Your gums may bleed and may be sensitive to brushing and flossing.  Dark spots or blotches (chloasma, mask of pregnancy) may develop on your face. This will likely fade after the baby is born.  A dark line from your belly button to the pubic area (linea nigra) may appear. This will likely fade after the  baby is born. WHAT TO EXPECT AT YOUR PRENATAL VISITS During a routine prenatal visit:  You will be weighed to make sure you and the fetus are growing normally.  Your blood pressure will be taken.  Your abdomen will be measured to track your baby's growth.  The fetal heartbeat will be listened to.  Any test results from the previous visit will be discussed. Your caregiver may ask you:  How you are feeling.  If you are feeling the baby move.  If you have had any abnormal symptoms, such as leaking fluid, bleeding, severe headaches, or abdominal cramping.  If you have any questions. Other tests that may be performed during your second trimester include:  Blood tests that check for:  Low iron levels (anemia).  Gestational diabetes (between 24 and 28 weeks).  Rh antibodies.  Urine tests to check for infections, diabetes, or protein in the urine.  An ultrasound to confirm the proper growth and development of the baby.  An amniocentesis to check for possible genetic problems.  Fetal screens for spina bifida and Down syndrome. HOME CARE INSTRUCTIONS   Avoid all smoking, herbs, alcohol, and unprescribed drugs. These chemicals affect the formation and growth of the baby.  Follow your caregiver's instructions regarding medicine use. There are medicines that are either safe or unsafe to take during pregnancy.  Exercise only as directed by your caregiver. Experiencing uterine cramps is a good sign to stop exercising.  Continue to eat regular,   healthy meals.  Wear a good support bra for breast tenderness.  Do not use hot tubs, steam rooms, or saunas.  Wear your seat belt at all times when driving.  Avoid raw meat, uncooked cheese, cat litter boxes, and soil used by cats. These carry germs that can cause birth defects in the baby.  Take your prenatal vitamins.  Try taking a stool softener (if your caregiver approves) if you develop constipation. Eat more high-fiber foods,  such as fresh vegetables or fruit and whole grains. Drink plenty of fluids to keep your urine clear or pale yellow.  Take warm sitz baths to soothe any pain or discomfort caused by hemorrhoids. Use hemorrhoid cream if your caregiver approves.  If you develop varicose veins, wear support hose. Elevate your feet for 15 minutes, 3 4 times a day. Limit salt in your diet.  Avoid heavy lifting, wear low heel shoes, and practice good posture.  Rest with your legs elevated if you have leg cramps or low back pain.  Visit your dentist if you have not gone yet during your pregnancy. Use a soft toothbrush to brush your teeth and be gentle when you floss.  A sexual relationship may be continued unless your caregiver directs you otherwise.  Continue to go to all your prenatal visits as directed by your caregiver. SEEK MEDICAL CARE IF:   You have dizziness.  You have mild pelvic cramps, pelvic pressure, or nagging pain in the abdominal area.  You have persistent nausea, vomiting, or diarrhea.  You have a bad smelling vaginal discharge.  You have pain with urination. SEEK IMMEDIATE MEDICAL CARE IF:   You have a fever.  You are leaking fluid from your vagina.  You have spotting or bleeding from your vagina.  You have severe abdominal cramping or pain.  You have rapid weight gain or loss.  You have shortness of breath with chest pain.  You notice sudden or extreme swelling of your face, hands, ankles, feet, or legs.  You have not felt your baby move in over an hour.  You have severe headaches that do not go away with medicine.  You have vision changes. Document Released: 10/19/2001 Document Revised: 06/27/2013 Document Reviewed: 12/26/2012 Exit[employer] Patient Information 2014 [employer].  

## 2014-01-07 NOTE — MAU Provider Note (Signed)
  History     CSN: [id number]  Arrival date and time: 01/07/14 1910   First Provider Initiated Contact with Patient 01/07/14 2013      No chief complaint on file.  HPI  Ashley Stout is a 23 y.o. G2P0010 at 6130w6d who presents today feeling cramps when she lays on her right side. She denies any bleeding at this time. She has also had a vaginal discharge, but states that it does not have an odor, and it is not itchy. [place]. She states that she is "really just here to get an ultrasound because she should be able to know what she is having, and she doesn't know yet".   Past Medical History  Diagnosis Date  . No pertinent past medical history     Past Surgical History  Procedure Laterality Date  . Induced abortion      Family History  Problem Relation Age of Onset  . Cancer Mother   . Hypertension Father     History  Substance Use Topics  . Smoking status: Current Every Day Smoker -- 0.50 packs/day    Types: Cigarettes  . Smokeless tobacco: Not on file  . Alcohol Use: Yes     Comment: LAST TIME WAS IN DEC    Allergies:  Allergies  Allergen Reactions  . Acetaminophen Nausea And Vomiting    Prescriptions prior to admission  Medication Sig Dispense Refill  . Prenatal Vit-Fe Fumarate-FA (PRENATAL MULTIVITAMIN) TABS tablet Take 1 tablet by mouth daily at 12 noon.        ROS Physical Exam   Blood pressure 110/54, pulse 90, temperature 99.5 F (37.5 C), temperature source Oral, last menstrual period 08/17/2013.  Physical Exam  Patient refuses to be examined. FHT 155 with doppler   MAU Course  Procedures  D/W the patient at length. Ultrasound is not indicated at this time. Discussed round ligament pain in depth with the patient. Offered the patient an outpatient ultrasound and an appointment to establish care with the clinic. Patient refuses and is demanding an ultrasound at this time.   D/W Dr.  Erin FullingHarraway-Smith, agrees that patient does not need an ultrasound.   House coverage d/w the patient as well, and has offered an appointment in the clinic and an outpatient ultrasound. Patient does not want an appointment in the clinic or an outpatient ultrasound at this time.   Assessment and Plan   1. Round ligament pain    [facility]  Follow-up Information   Schedule an appointment as soon as possible [facility] [place].   Contact information:   [address] [place] [id number] [phone]       Tawnya CrookHogan, Leland Staszewski Donovan 01/07/2014, 8:20 PM
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
## 2014-01-07 NOTE — MAU Provider Note (Signed)
Attestation of Attending Supervision of Advanced Practitioner (CNM/NP): Evaluation and management procedures were performed by the Advanced Practitioner under my supervision and collaboration.  I have reviewed the Advanced Practitioner's note and chart, and I agree with the management and plan.  HARRAWAY-SMITH, Jenisse Vullo 9:46 PM

## 2014-02-11 ENCOUNTER — Other Ambulatory Visit: Payer: Self-pay

## 2014-02-21 ENCOUNTER — Emergency Department (HOSPITAL_COMMUNITY): Payer: No Typology Code available for payment source

## 2014-02-21 ENCOUNTER — Inpatient Hospital Stay (HOSPITAL_COMMUNITY)
Admission: EM | Admit: 2014-02-21 | Discharge: 2014-02-21 | Disposition: A | Discharge status: Home / Self Care | Payer: No Typology Code available for payment source | Attending: Obstetrics and Gynecology | Admitting: Obstetrics and Gynecology

## 2014-02-21 ENCOUNTER — Encounter (HOSPITAL_COMMUNITY): Payer: Self-pay | Admitting: *Deleted

## 2014-02-21 DIAGNOSIS — R1084 Generalized abdominal pain: Secondary | ICD-10-CM

## 2014-02-21 DIAGNOSIS — O99330 Smoking (tobacco) complicating pregnancy, unspecified trimester: Secondary | ICD-10-CM | POA: Insufficient documentation

## 2014-02-21 DIAGNOSIS — Y92410 Unspecified street and highway as the place of occurrence of the external cause: Secondary | ICD-10-CM | POA: Insufficient documentation

## 2014-02-21 DIAGNOSIS — O4700 False labor before 37 completed weeks of gestation, unspecified trimester: Secondary | ICD-10-CM | POA: Insufficient documentation

## 2014-02-21 DIAGNOSIS — R109 Unspecified abdominal pain: Secondary | ICD-10-CM

## 2014-02-21 DIAGNOSIS — O9989 Other specified diseases and conditions complicating pregnancy, childbirth and the puerperium: Secondary | ICD-10-CM

## 2014-02-21 DIAGNOSIS — O26899 Other specified pregnancy related conditions, unspecified trimester: Secondary | ICD-10-CM

## 2014-02-21 DIAGNOSIS — O99891 Other specified diseases and conditions complicating pregnancy: Secondary | ICD-10-CM | POA: Insufficient documentation

## 2014-02-21 LAB — CBC WITH DIFFERENTIAL/PLATELET
Basophils Absolute: 0 10*3/uL (ref 0.0–0.1)
Basophils Relative: 0 % (ref 0–1)
Eosinophils Absolute: 0.1 10*3/uL (ref 0.0–0.7)
Eosinophils Relative: 1 % (ref 0–5)
HCT: 33.1 % — ABNORMAL LOW (ref 36.0–46.0)
Hemoglobin: 11.5 g/dL — ABNORMAL LOW (ref 12.0–15.0)
LYMPHS ABS: 1.2 10*3/uL (ref 0.7–4.0)
Lymphocytes Relative: 15 % (ref 12–46)
MCH: 30.8 pg (ref 26.0–34.0)
MCHC: 34.7 g/dL (ref 30.0–36.0)
MCV: 88.7 fL (ref 78.0–100.0)
Monocytes Absolute: 0.5 10*3/uL (ref 0.1–1.0)
Monocytes Relative: 6 % (ref 3–12)
NEUTROS PCT: 78 % — AB (ref 43–77)
Neutro Abs: 6.7 10*3/uL (ref 1.7–7.7)
Platelets: 215 10*3/uL (ref 150–400)
RBC: 3.73 MIL/uL — AB (ref 3.87–5.11)
RDW: 12.4 % (ref 11.5–15.5)
WBC: 8.5 10*3/uL (ref 4.0–10.5)

## 2014-02-21 LAB — URINE MICROSCOPIC-ADD ON

## 2014-02-21 LAB — COMPREHENSIVE METABOLIC PANEL
ALK PHOS: 57 U/L (ref 39–117)
ALT: 8 U/L (ref 0–35)
AST: 18 U/L (ref 0–37)
Albumin: 3 g/dL — ABNORMAL LOW (ref 3.5–5.2)
BUN: 4 mg/dL — ABNORMAL LOW (ref 6–23)
CO2: 21 mEq/L (ref 19–32)
Calcium: 8.6 mg/dL (ref 8.4–10.5)
Chloride: 101 mEq/L (ref 96–112)
Creatinine, Ser: 0.56 mg/dL (ref 0.50–1.10)
GFR calc Af Amer: >90 mL/min (ref >90)
GFR calc non Af Amer: >90 mL/min (ref >90)
GLUCOSE: 66 mg/dL — AB (ref 70–99)
POTASSIUM: 3.3 meq/L — AB (ref 3.7–5.3)
SODIUM: 137 meq/L (ref 137–147)
TOTAL PROTEIN: 6.9 g/dL (ref 6.0–8.3)
Total Bilirubin: <0.2 mg/dL — ABNORMAL LOW (ref 0.3–1.2)

## 2014-02-21 LAB — URINALYSIS, ROUTINE W REFLEX MICROSCOPIC
BILIRUBIN URINE: NEGATIVE
GLUCOSE, UA: NEGATIVE mg/dL
HGB URINE DIPSTICK: NEGATIVE
KETONES UR: NEGATIVE mg/dL
Nitrite: NEGATIVE
PH: 7 (ref 5.0–8.0)
Protein, ur: NEGATIVE mg/dL
Specific Gravity, Urine: 1.008 (ref 1.005–1.030)
Urobilinogen, UA: 0.2 mg/dL (ref 0.0–1.0)

## 2014-02-21 MED ORDER — SODIUM CHLORIDE 0.9 % IV BOLUS (SEPSIS): 1000.0000 mL | Freq: Once | INTRAVENOUS | Status: DC

## 2014-02-21 MED ORDER — SODIUM CHLORIDE 0.9 % IV SOLN: INTRAVENOUS | Status: DC

## 2014-02-21 NOTE — MAU Note (Signed)
[facility] monitoring. Pt denies any pain or discomfort at this time.
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
## 2014-02-21 NOTE — Progress Notes (Signed)
[facility] here to transfer patient 
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
## 2014-02-21 NOTE — Progress Notes (Signed)
Patient to x-ray, EFM off.
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
## 2014-04-24 ENCOUNTER — Inpatient Hospital Stay (HOSPITAL_COMMUNITY)
Admission: AD | Admit: 2014-04-24 | Discharge: 2014-04-24 | Disposition: A | Discharge status: Home / Self Care | Payer: Medicaid Other | Source: Ambulatory Visit | Attending: Obstetrics and Gynecology | Admitting: Obstetrics and Gynecology

## 2014-04-24 ENCOUNTER — Encounter (HOSPITAL_COMMUNITY): Payer: Self-pay | Admitting: *Deleted

## 2014-04-24 DIAGNOSIS — N859 Noninflammatory disorder of uterus, unspecified: Secondary | ICD-10-CM

## 2014-04-24 DIAGNOSIS — O99330 Smoking (tobacco) complicating pregnancy, unspecified trimester: Secondary | ICD-10-CM | POA: Insufficient documentation

## 2014-04-24 DIAGNOSIS — R42 Dizziness and giddiness: Secondary | ICD-10-CM | POA: Insufficient documentation

## 2014-04-24 DIAGNOSIS — O368190 Decreased fetal movements, unspecified trimester, not applicable or unspecified: Secondary | ICD-10-CM | POA: Insufficient documentation

## 2014-04-24 DIAGNOSIS — B373 Candidiasis of vulva and vagina: Secondary | ICD-10-CM

## 2014-04-24 DIAGNOSIS — O4700 False labor before 37 completed weeks of gestation, unspecified trimester: Secondary | ICD-10-CM | POA: Insufficient documentation

## 2014-04-24 DIAGNOSIS — O2390 Unspecified genitourinary tract infection in pregnancy, unspecified trimester: Secondary | ICD-10-CM | POA: Insufficient documentation

## 2014-04-24 DIAGNOSIS — B3731 Acute candidiasis of vulva and vagina: Secondary | ICD-10-CM | POA: Insufficient documentation

## 2014-04-24 DIAGNOSIS — N858 Other specified noninflammatory disorders of uterus: Secondary | ICD-10-CM

## 2014-04-24 LAB — URINE MICROSCOPIC-ADD ON

## 2014-04-24 LAB — URINALYSIS, ROUTINE W REFLEX MICROSCOPIC
Bilirubin Urine: NEGATIVE
Glucose, UA: NEGATIVE mg/dL
Hgb urine dipstick: NEGATIVE
KETONES UR: NEGATIVE mg/dL
NITRITE: NEGATIVE
PH: 7 (ref 5.0–8.0)
PROTEIN: NEGATIVE mg/dL
Specific Gravity, Urine: 1.01 (ref 1.005–1.030)
Urobilinogen, UA: 0.2 mg/dL (ref 0.0–1.0)

## 2014-04-24 LAB — FETAL FIBRONECTIN: Fetal Fibronectin: NEGATIVE

## 2014-04-24 MED ORDER — FLUCONAZOLE 150 MG PO TABS: 150.0000 mg | ORAL_TABLET | Freq: Once | ORAL | Status: DC

## 2014-04-24 MED ORDER — TERCONAZOLE 0.4 % VA CREA: 1.0000 | TOPICAL_CREAM | Freq: Every day | VAGINAL | Status: DC

## 2014-04-24 NOTE — MAU Note (Signed)
C/o feeling a small amount of vaginal leaking today; c/o dizziness for past 3 days and decreased fetal movement for past 3 days; requesting an u/s;

## 2014-04-24 NOTE — Discharge Instructions (Signed)

## 2014-04-24 NOTE — MAU Provider Note (Signed)
History     CSN: [id number]  Arrival date and time: 04/24/14 1445   First Ashley Stout Initiated Contact with Patient 04/24/14 1519      Chief Complaint  Patient presents with  . Dizziness  . Rupture of Membranes   HPI This is a 23 y.o. female at 1042w3d who presents with c/o liquid discharge, which is also thick and clumpy.  Also c/o dizziness and decreased fetal movement. Does not admit to dizziness to me. Also c/o some cramping for 2 days.  Did not call office "because I know they can't see me"   RN Note' C/o feeling a small amount of vaginal leaking today; c/o dizziness for past 3 days and decreased fetal movement for past 3 days; requesting an u/s;       OB History   Grav Para Term Preterm Abortions TAB SAB Ect Mult Living   2    1 1           Past Medical History  Diagnosis Date  . No pertinent past medical history     Past Surgical History  Procedure Laterality Date  . Induced abortion      Family History  Problem Relation Age of Onset  . Cancer Mother   . Hypertension Father     History  Substance Use Topics  . Smoking status: Current Every Day Smoker -- 0.50 packs/day    Types: Cigarettes  . Smokeless tobacco: Not on file  . Alcohol Use: Yes     Comment: LAST TIME WAS IN DEC    Allergies:  Allergies  Allergen Reactions  . Acetaminophen Nausea And Vomiting    Prescriptions prior to admission  Medication Sig Dispense Refill  . OVER THE COUNTER MEDICATION Take 2 capsules by mouth daily. Prenatal Gummy        Review of Systems  Constitutional: Negative for fever and chills.  Eyes: Negative for blurred vision and double vision.  Respiratory: Negative for shortness of breath.   Cardiovascular: Negative for chest pain.  Gastrointestinal: Negative for nausea, vomiting, abdominal pain, diarrhea and constipation.  Genitourinary: Negative for dysuria.       Vaginal discharge with itching  Musculoskeletal: Negative for myalgias.  Neurological: Positive  for dizziness.   Physical Exam   Blood pressure 110/67, pulse 104, temperature 98.4 F (36.9 C), temperature source Oral, resp. rate 16, height 5\' 4"  (1.626 m), weight 58.968 kg (130 lb), last menstrual period 08/17/2013.  Physical Exam  Constitutional: She is oriented to person, place, and time. She appears well-developed and well-nourished. No distress.  HENT:  Head: Normocephalic.  Cardiovascular: Normal rate.   Respiratory: Effort normal.  GI: Soft. She exhibits no distension. There is no tenderness. There is no rebound and no guarding.  Genitourinary: Uterus normal. Vaginal discharge (Very Copious cottage-cheese discharge, minimal erethema, tender to touch) found.  Cervix long/closed but soft FHR reactive Uterine irritability noted FFn collected  Musculoskeletal: Normal range of motion.  Neurological: She is alert and oriented to person, place, and time.  Skin: Skin is warm and dry.  Psychiatric: She has a normal mood and affect.    MAU Course  Procedures  MDM FFn sent Results for orders placed during the hospital encounter of 04/24/14 (from the past 24 hour(s))  URINALYSIS, ROUTINE W REFLEX MICROSCOPIC     Status: Abnormal   Collection Time    04/24/14  3:05 PM      Result Value Ref Range   Color, Urine YELLOW  YELLOW   APPearance  CLEAR  CLEAR   Specific Gravity, Urine 1.010  1.005 - 1.030   pH 7.0  5.0 - 8.0   Glucose, UA NEGATIVE  NEGATIVE mg/dL   Hgb urine dipstick NEGATIVE  NEGATIVE   Bilirubin Urine NEGATIVE  NEGATIVE   Ketones, ur NEGATIVE  NEGATIVE mg/dL   Protein, ur NEGATIVE  NEGATIVE mg/dL   Urobilinogen, UA 0.2  0.0 - 1.0 mg/dL   Nitrite NEGATIVE  NEGATIVE   Leukocytes, UA LARGE (*) NEGATIVE  URINE MICROSCOPIC-ADD ON     Status: Abnormal   Collection Time    04/24/14  3:05 PM      Result Value Ref Range   Squamous Epithelial / LPF MANY (*) RARE   WBC, UA 3-6  <3 WBC/hpf   RBC / HPF 0-2  <3 RBC/hpf   Bacteria, UA FEW (*) RARE  FETAL FIBRONECTIN      Status: None   Collection Time    04/24/14  3:27 PM      Result Value Ref Range   Fetal Fibronectin NEGATIVE  NEGATIVE   Wet prep not done since discharge was so prevalent and distinctive  Assessment and Plan  A:  SIUP at 4070w3d       Yeast vaginitis       Uterine irritability with cervical softening, but Negative FFn  P; Discussed with Dr Tenny Crawoss      Discharge home      PTL precautions      Rx Diflucan and Terazol 7 (for more immediate relief)       Follow up in office   [name],Ashley 04/24/2014, 3:31 PM

## 2014-05-27 ENCOUNTER — Other Ambulatory Visit (HOSPITAL_COMMUNITY): Payer: Self-pay | Admitting: Obstetrics and Gynecology

## 2014-05-27 DIAGNOSIS — R6252 Short stature (child): Secondary | ICD-10-CM

## 2014-05-28 ENCOUNTER — Other Ambulatory Visit (HOSPITAL_COMMUNITY): Payer: Self-pay | Admitting: Obstetrics and Gynecology

## 2014-05-28 ENCOUNTER — Ambulatory Visit (HOSPITAL_COMMUNITY)
Admission: RE | Admit: 2014-05-28 | Discharge: 2014-05-28 | Disposition: A | Discharge status: Home / Self Care | Payer: Medicaid Other | Source: Ambulatory Visit | Attending: Obstetrics and Gynecology | Admitting: Obstetrics and Gynecology

## 2014-05-28 DIAGNOSIS — O365990 Maternal care for other known or suspected poor fetal growth, unspecified trimester, not applicable or unspecified: Secondary | ICD-10-CM | POA: Insufficient documentation

## 2014-05-28 DIAGNOSIS — Z3689 Encounter for other specified antenatal screening: Secondary | ICD-10-CM | POA: Insufficient documentation

## 2014-05-28 DIAGNOSIS — R6252 Short stature (child): Secondary | ICD-10-CM

## 2014-05-28 DIAGNOSIS — O26849 Uterine size-date discrepancy, unspecified trimester: Secondary | ICD-10-CM | POA: Insufficient documentation

## 2014-09-09 ENCOUNTER — Encounter (HOSPITAL_COMMUNITY): Payer: Self-pay | Admitting: *Deleted

## 2014-11-12 ENCOUNTER — Encounter (HOSPITAL_COMMUNITY): Payer: Self-pay | Admitting: *Deleted

## 2014-12-30 ENCOUNTER — Emergency Department (HOSPITAL_COMMUNITY)
Admission: EM | Admit: 2014-12-30 | Discharge: 2014-12-31 | Disposition: A | Discharge status: Home / Self Care | Payer: Medicaid Other | Attending: Emergency Medicine | Admitting: Emergency Medicine

## 2014-12-30 ENCOUNTER — Emergency Department (HOSPITAL_COMMUNITY): Payer: Medicaid Other

## 2014-12-30 ENCOUNTER — Encounter (HOSPITAL_COMMUNITY): Payer: Self-pay

## 2014-12-30 DIAGNOSIS — X58XXXA Exposure to other specified factors, initial encounter: Secondary | ICD-10-CM | POA: Insufficient documentation

## 2014-12-30 DIAGNOSIS — S43004A Unspecified dislocation of right shoulder joint, initial encounter: Secondary | ICD-10-CM

## 2014-12-30 DIAGNOSIS — S43014A Anterior dislocation of right humerus, initial encounter: Secondary | ICD-10-CM | POA: Insufficient documentation

## 2014-12-30 DIAGNOSIS — Z720 Tobacco use: Secondary | ICD-10-CM | POA: Diagnosis not present

## 2014-12-30 DIAGNOSIS — Z79899 Other long term (current) drug therapy: Secondary | ICD-10-CM | POA: Diagnosis not present

## 2014-12-30 DIAGNOSIS — Y9372 Activity, wrestling: Secondary | ICD-10-CM | POA: Insufficient documentation

## 2014-12-30 DIAGNOSIS — Y998 Other external cause status: Secondary | ICD-10-CM | POA: Insufficient documentation

## 2014-12-30 DIAGNOSIS — S4991XA Unspecified injury of right shoulder and upper arm, initial encounter: Secondary | ICD-10-CM | POA: Diagnosis present

## 2014-12-30 DIAGNOSIS — Y92009 Unspecified place in unspecified non-institutional (private) residence as the place of occurrence of the external cause: Secondary | ICD-10-CM | POA: Insufficient documentation

## 2014-12-30 MED ORDER — ONDANSETRON HCL 4 MG/2ML IJ SOLN
4.0000 mg | Freq: Once | INTRAMUSCULAR | Status: AC
  Administered 2014-12-30: 4 mg via INTRAVENOUS
  Filled 2014-12-30: qty 2

## 2014-12-30 MED ORDER — PROPOFOL 10 MG/ML IV BOLUS
0.5000 mg/kg | Freq: Once | INTRAVENOUS | Status: AC
  Administered 2014-12-30: 30 mg via INTRAVENOUS
  Filled 2014-12-30: qty 1

## 2014-12-30 MED ORDER — KETAMINE HCL 10 MG/ML IJ SOLN
0.5000 mg/kg | Freq: Once | INTRAMUSCULAR | Status: AC
  Administered 2014-12-30: 30 mg via INTRAVENOUS

## 2014-12-30 MED ORDER — FENTANYL CITRATE 0.05 MG/ML IJ SOLN
50.0000 ug | Freq: Once | INTRAMUSCULAR | Status: AC
  Administered 2014-12-30: 50 ug via INTRAVENOUS
  Filled 2014-12-30: qty 2

## 2014-12-30 NOTE — ED Notes (Signed)
Pt to xray

## 2014-12-30 NOTE — ED Notes (Signed)
Informed consent signed for precedural sedation/closed reduction of right shoulder dislocation. Witnessed by Terrilyn SaverJoAnna Darrian Goodwill RN and physician signed.

## 2014-12-30 NOTE — ED Provider Notes (Signed)
CSN: [id number]     Arrival date & time 12/30/14  2206 History   First MD Initiated Contact with Patient 12/30/14 2229     Chief Complaint  Patient presents with  . Shoulder Injury     (Consider location/radiation/quality/duration/timing/severity/associated sxs/prior Treatment) HPI   24yf with r shoulder pain. Acute onset just before arrival to ED. Happened while wrestling with another person. Previous shoulder dislocations but was able to reduce herself. No other acute complaints. No numbness or tingling. Fentanyl by EMS prior to arrival.   Past Medical History  Diagnosis Date  . No pertinent past medical history    Past Surgical History  Procedure Laterality Date  . Induced abortion     Family History  Problem Relation Age of Onset  . Cancer Mother   . Hypertension Father    History  Substance Use Topics  . Smoking status: Current Every Day Smoker -- 0.50 packs/day    Types: Cigarettes  . Smokeless tobacco: Not on file  . Alcohol Use: Yes     Comment: LAST TIME WAS IN DEC   OB History    Gravida Para Term Preterm AB TAB SAB Ectopic Multiple Living   2    1 1          Review of Systems  All systems reviewed and negative, other than as noted in HPI.   Allergies  Acetaminophen  Home Medications   Prior to Admission medications   Medication Sig Start Date End Date Taking? Authorizing Provider  fluconazole (DIFLUCAN) 150 MG tablet Take 1 tablet (150 mg total) by mouth once. 04/24/14   Aviva SignsMarie L Williams, CNM  OVER THE COUNTER MEDICATION Take 2 capsules by mouth daily. Prenatal Gummy    Historical Provider, MD  terconazole (TERAZOL 7) 0.4 % vaginal cream Place 1 applicator vaginally at bedtime. 04/24/14   Aviva SignsMarie L Williams, CNM   BP 130/88 mmHg  Pulse 78  Temp(Src) 98.8 F (37.1 C) (Oral)  Resp 18  Ht 5\' 4"  (1.626 m)  Wt 110 lb (49.896 kg)  BMI 18.87 kg/m2  SpO2 100%  LMP 12/01/2014 Physical Exam  Constitutional: She appears well-developed and well-nourished.  No distress.  HENT:  Head: Normocephalic and atraumatic.  Eyes: Conjunctivae are normal. Right eye exhibits no discharge. Left eye exhibits no discharge.  Neck: Neck supple.  Cardiovascular: Normal rate, regular rhythm and normal heart sounds.  Exam reveals no gallop and no friction rub.   No murmur heard. Pulmonary/Chest: Effort normal and breath sounds normal. No respiratory distress.  Abdominal: Soft. She exhibits no distension. There is no tenderness.  Musculoskeletal:  Squared off appearance consistent with anterior shoulder dislocation. No deltoid anesthesia. Resists any attempts at ROM. Can make fist. Palpable radial pulse.   Neurological: She is alert.  Skin: Skin is warm and dry.  Psychiatric: She has a normal mood and affect. Her behavior is normal. Thought content normal.  Nursing note and vitals reviewed.   ED Course  Reduction of dislocation Date/Time: 12/30/2014 11:30 PM Performed by: Raeford RazorKOHUT, Nailea Whitehorn Authorized by: Raeford RazorKOHUT, Timmey Lamba Consent: Verbal consent obtained. Written consent obtained. Risks and benefits: risks, benefits and alternatives were discussed Consent given by: patient Patient identity confirmed: verbally with patient and provided demographic data Local anesthesia used: no Patient sedated: yes Sedation type: moderate (conscious) sedation Analgesia: fentanyl Vitals: Vital signs were monitored during sedation. Patient tolerance: Patient tolerated the procedure well with no immediate complications Comments: Easily reduced with external rotation    PROCEDURAL SEDATION:  Preprocedure  Pre-anesthesia/induction confirmation of laterality/correct procedure site including "time-out."  Provider confirms review of the nurses' note, allergies, medications, pertinent labs, PMH, pre-induction vital signs, pulse oximetry, pain level, and ECG (as applicable), and patient condition satisfactory for commencing with order for sedation and procedure.  Medication(s):    Ketamine 30 mg IV Propofol 30 mg IV  Patient tolerated procedure and procedural sedation component as expected without apparent immediate complications.  Physician confirms procedural medication orders as administered, patient was assessed by physician post-procedure, and confirms post-sedation plan of care and disposition.  Total time of sedation/monitoring: 30 minutes  (including critical care time) Labs Review Labs Reviewed  PREGNANCY, URINE  POC URINE PREG, ED    Imaging Review Dg Shoulder Right  12/30/2014   CLINICAL DATA:  Pain in the right shoulder with obvious deformity after wrestling with her cousin. Previous dislocations of the right shoulder.  EXAM: RIGHT SHOULDER - 2+ VIEW  COMPARISON:  None.  FINDINGS: Anterior dislocation of the right shoulder. Small focal defect in the lateral humeral head suggesting Hill-Sachs deformity. Coracoclavicular and acromioclavicular spaces are maintained. Visualized right ribs appear intact.  IMPRESSION: Anterior dislocation of the right shoulder.   Electronically Signed   By: Burman Nieves M.D.   On: 12/30/2014 23:30     EKG Interpretation None      MDM   Final diagnoses:  Dislocation closed, shoulder, right, initial encounter    24yF with closed R anterior shoulder dislocation. Reduced. Sling. Remains NVI post procedure. Recurrent issue. Recommended routine outpt ortho FU.     Raeford Razor, MD 12/31/14 [phone]

## 2014-12-30 NOTE — ED Notes (Signed)
Per EMS, patient popped her shoulder out of place while wrestling at home.  She reports she has disclocated shoulder twice in the past, but was able to reduce it herself.  She was not successful this time.  EMS administered of Fentanyl en route, but she is yelling on arrival to ED.

## 2014-12-31 NOTE — ED Notes (Addendum)
Patient is calling for a ride home.

## 2015-05-19 ENCOUNTER — Inpatient Hospital Stay (HOSPITAL_COMMUNITY): Payer: Medicaid Other

## 2015-05-19 ENCOUNTER — Inpatient Hospital Stay (HOSPITAL_COMMUNITY)
Admission: AD | Admit: 2015-05-19 | Discharge: 2015-05-19 | Disposition: A | Discharge status: Home / Self Care | Payer: Medicaid Other | Source: Ambulatory Visit | Attending: Obstetrics & Gynecology | Admitting: Obstetrics & Gynecology

## 2015-05-19 ENCOUNTER — Encounter (HOSPITAL_COMMUNITY): Payer: Self-pay | Admitting: *Deleted

## 2015-05-19 DIAGNOSIS — R8271 Bacteriuria: Secondary | ICD-10-CM

## 2015-05-19 DIAGNOSIS — F17210 Nicotine dependence, cigarettes, uncomplicated: Secondary | ICD-10-CM | POA: Insufficient documentation

## 2015-05-19 DIAGNOSIS — O2341 Unspecified infection of urinary tract in pregnancy, first trimester: Secondary | ICD-10-CM | POA: Diagnosis not present

## 2015-05-19 DIAGNOSIS — O23591 Infection of other part of genital tract in pregnancy, first trimester: Secondary | ICD-10-CM | POA: Insufficient documentation

## 2015-05-19 DIAGNOSIS — O98811 Other maternal infectious and parasitic diseases complicating pregnancy, first trimester: Secondary | ICD-10-CM | POA: Insufficient documentation

## 2015-05-19 DIAGNOSIS — B9689 Other specified bacterial agents as the cause of diseases classified elsewhere: Secondary | ICD-10-CM | POA: Diagnosis not present

## 2015-05-19 DIAGNOSIS — N760 Acute vaginitis: Secondary | ICD-10-CM | POA: Diagnosis not present

## 2015-05-19 DIAGNOSIS — O209 Hemorrhage in early pregnancy, unspecified: Secondary | ICD-10-CM

## 2015-05-19 DIAGNOSIS — R109 Unspecified abdominal pain: Secondary | ICD-10-CM

## 2015-05-19 DIAGNOSIS — O26899 Other specified pregnancy related conditions, unspecified trimester: Secondary | ICD-10-CM

## 2015-05-19 DIAGNOSIS — Z3A10 10 weeks gestation of pregnancy: Secondary | ICD-10-CM | POA: Insufficient documentation

## 2015-05-19 DIAGNOSIS — O208 Other hemorrhage in early pregnancy: Secondary | ICD-10-CM | POA: Diagnosis not present

## 2015-05-19 DIAGNOSIS — O99331 Smoking (tobacco) complicating pregnancy, first trimester: Secondary | ICD-10-CM | POA: Insufficient documentation

## 2015-05-19 DIAGNOSIS — B373 Candidiasis of vulva and vagina: Secondary | ICD-10-CM | POA: Diagnosis not present

## 2015-05-19 DIAGNOSIS — O468X1 Other antepartum hemorrhage, first trimester: Secondary | ICD-10-CM

## 2015-05-19 DIAGNOSIS — B379 Candidiasis, unspecified: Secondary | ICD-10-CM

## 2015-05-19 DIAGNOSIS — O418X10 Other specified disorders of amniotic fluid and membranes, first trimester, not applicable or unspecified: Secondary | ICD-10-CM

## 2015-05-19 LAB — URINALYSIS, ROUTINE W REFLEX MICROSCOPIC
Bilirubin Urine: NEGATIVE
GLUCOSE, UA: NEGATIVE mg/dL
HGB URINE DIPSTICK: NEGATIVE
Ketones, ur: NEGATIVE mg/dL
Nitrite: NEGATIVE
PROTEIN: NEGATIVE mg/dL
Specific Gravity, Urine: 1.02 (ref 1.005–1.030)
Urobilinogen, UA: 0.2 mg/dL (ref 0.0–1.0)
pH: 7 (ref 5.0–8.0)

## 2015-05-19 LAB — CBC
HCT: 32.5 % — ABNORMAL LOW (ref 36.0–46.0)
Hemoglobin: 11.6 g/dL — ABNORMAL LOW (ref 12.0–15.0)
MCH: 30.9 pg (ref 26.0–34.0)
MCHC: 35.7 g/dL (ref 30.0–36.0)
MCV: 86.4 fL (ref 78.0–100.0)
Platelets: 270 10*3/uL (ref 150–400)
RBC: 3.76 MIL/uL — ABNORMAL LOW (ref 3.87–5.11)
RDW: 13 % (ref 11.5–15.5)
WBC: 7.6 10*3/uL (ref 4.0–10.5)

## 2015-05-19 LAB — POCT PREGNANCY, URINE: PREG TEST UR: POSITIVE — AB

## 2015-05-19 LAB — URINE MICROSCOPIC-ADD ON

## 2015-05-19 LAB — HCG, QUANTITATIVE, PREGNANCY: hCG, Beta Chain, Quant, S: 217457 m[IU]/mL — ABNORMAL HIGH (ref <5)

## 2015-05-19 LAB — GC/CHLAMYDIA PROBE AMP (~~LOC~~) NOT AT ARMC
CHLAMYDIA, DNA PROBE: NEGATIVE
Neisseria Gonorrhea: NEGATIVE

## 2015-05-19 LAB — WET PREP, GENITAL: Trich, Wet Prep: NONE SEEN

## 2015-05-19 LAB — HIV ANTIBODY (ROUTINE TESTING W REFLEX): HIV Screen 4th Generation wRfx: NONREACTIVE

## 2015-05-19 MED ORDER — NITROFURANTOIN MONOHYD MACRO 100 MG PO CAPS: 100.0000 mg | ORAL_CAPSULE | Freq: Two times a day (BID) | ORAL | Status: DC

## 2015-05-19 MED ORDER — METRONIDAZOLE 500 MG PO TABS: 500.0000 mg | ORAL_TABLET | Freq: Two times a day (BID) | ORAL | Status: DC

## 2015-05-19 NOTE — MAU Provider Note (Signed)
History     CSN: [id number]  Arrival date and time: 05/19/15 0225   First Provider Initiated Contact with Patient 05/19/15 0319      Chief Complaint  Patient presents with  . Vaginal Bleeding   HPI  Ms. Ashley Stout is a 24 y.o. G3P0011 at 538w3d here with report of intermitting vaginal bleeding and lower bilateral pelvic pain x 1.5 weeks.  Pain is described as sharp and rate 9/10.  Bleeding is light in nature.  Patient's last menstrual period was 03/07/2015 (approximate).   Past Medical History  Diagnosis Date  . No pertinent past medical history     Past Surgical History  Procedure Laterality Date  . Induced abortion      Family History  Problem Relation Age of Onset  . Cancer Mother   . Hypertension Father     History  Substance Use Topics  . Smoking status: Current Every Day Smoker -- 0.50 packs/day    Types: Cigarettes  . Smokeless tobacco: Not on file  . Alcohol Use: Yes     Comment: LAST TIME WAS   02-2015    Allergies:  Allergies  Allergen Reactions  . Acetaminophen Nausea And Vomiting    Prescriptions prior to admission  Medication Sig Dispense Refill Last Dose  . fluconazole (DIFLUCAN) 150 MG tablet Take 1 tablet (150 mg total) by mouth once. 1 tablet 3 More than a month at Unknown time  . OVER THE COUNTER MEDICATION Take 2 capsules by mouth daily. Prenatal Gummy   More than a month at Unknown time  . terconazole (TERAZOL 7) 0.4 % vaginal cream Place 1 applicator vaginally at bedtime. 45 g 0 More than a month at Unknown time    Review of Systems  Gastrointestinal: Positive for nausea, vomiting and abdominal pain.  Genitourinary: Negative for dysuria, urgency and frequency.       Vaginal bleeding  All other systems reviewed and are negative.  Physical Exam   Blood pressure 110/64, pulse 88, temperature 99.1 F (37.3 C), temperature source Oral, resp. rate 20, height 5\' 3"  (1.6 m), weight 104 lb 8 oz (47.401 kg), last menstrual period  03/07/2015, unknown if currently breastfeeding.  Physical Exam  Constitutional: She is oriented to person, place, and time. She appears well-developed and well-nourished.  HENT:  Head: Normocephalic.  Neck: Normal range of motion. Neck supple.  Cardiovascular: Normal rate, regular rhythm and normal heart sounds.   Respiratory: Effort normal and breath sounds normal. No respiratory distress.  GI: Soft. She exhibits no mass. There is no tenderness. There is no rebound and no guarding.  Genitourinary: Uterus is enlarged. Right adnexum displays no mass. Left adnexum displays no mass. Vaginal discharge (thick, white discharge) found.  Neurological: She is alert and oriented to person, place, and time.  Skin: Skin is warm and dry.    MAU Course  Procedures Results for orders placed or performed during the hospital encounter of 05/19/15 (from the past 24 hour(s))  Urinalysis, Routine w reflex microscopic (not [facility])     Status: Abnormal   Collection Time: 05/19/15  2:35 AM  Result Value Ref Range   Color, Urine YELLOW YELLOW   APPearance CLEAR CLEAR   Specific Gravity, Urine 1.020 1.005 - 1.030   pH 7.0 5.0 - 8.0   Glucose, UA NEGATIVE NEGATIVE mg/dL   Hgb urine dipstick NEGATIVE NEGATIVE   Bilirubin Urine NEGATIVE NEGATIVE   Ketones, ur NEGATIVE NEGATIVE mg/dL   Protein, ur NEGATIVE NEGATIVE mg/dL  Urobilinogen, UA 0.2 0.0 - 1.0 mg/dL   Nitrite NEGATIVE NEGATIVE   Leukocytes, UA MODERATE (A) NEGATIVE  Urine microscopic-add on     Status: Abnormal   Collection Time: 05/19/15  2:35 AM  Result Value Ref Range   Squamous Epithelial / LPF RARE RARE   WBC, UA 3-6 <3 WBC/hpf   RBC / HPF 0-2 <3 RBC/hpf   Bacteria, UA FEW (A) RARE  Pregnancy, urine POC     Status: Abnormal   Collection Time: 05/19/15  2:47 AM  Result Value Ref Range   Preg Test, Ur POSITIVE (A) NEGATIVE  CBC     Status: Abnormal   Collection Time: 05/19/15  3:40 AM  Result Value Ref Range   WBC 7.6 4.0 - 10.5 K/uL    RBC 3.76 (L) 3.87 - 5.11 MIL/uL   Hemoglobin 11.6 (L) 12.0 - 15.0 g/dL   HCT 16.1 (L) 09.6 - 04.5 %   MCV 86.4 78.0 - 100.0 fL   MCH 30.9 26.0 - 34.0 pg   MCHC 35.7 30.0 - 36.0 g/dL   RDW 40.9 81.1 - 91.4 %   Platelets 270 150 - 400 K/uL  Wet prep, genital     Status: Abnormal   Collection Time: 05/19/15  3:52 AM  Result Value Ref Range   Yeast Wet Prep HPF POC FEW (A) NONE SEEN   Trich, Wet Prep NONE SEEN NONE SEEN   Clue Cells Wet Prep HPF POC FEW (A) NONE SEEN   WBC, Wet Prep HPF POC TOO NUMEROUS TO COUNT (A) NONE SEEN   Ultrasound: FINDINGS: Intrauterine gestational sac: A single intrauterine pregnancy is identified.  Yolk sac: Present  Embryo: Present  Cardiac Activity: Present  Heart Rate: 167 bpm  CRL: 49 mm 11 w 5 d [place] EDC: 12/03/2015  Maternal uterus/adnexae: Uterus appears anteverted. Moderate subchorionic hemorrhage is identified with 2 focal areas noted, 1 fundal and 1 inferiorly. Both ovaries are visualized and appear normal. No abnormal adnexal masses are seen. No free fluid in the pelvis.  IMPRESSION: Single intrauterine pregnancy. Estimated gestational age by crown-rump length is 11 weeks 5 days. Moderate subchorionic hemorrhage is present.  Assessment and Plan  24 y.o. G3P0011 at [redacted]w[redacted]d IUP Bacteria in Urine Yeast Infection Subchorionic Hemorrhage Bacterial Vaginosis  Plan:  Discharge to home RX Macrobid 100 mg BID x 7 days Urine culture  Monistat OTC cream Flagyl 500 mg BID x 7 days  Rochele Pages N 05/19/2015, 4:41 AM

## 2015-05-19 NOTE — MAU Note (Signed)
PT  SAYS  SHE  HAD LMP -  4-29-    THEN  NO CYCLE  IN June -  SO HPT-  POSITIVE.   STARTED VAG  DARK BROWN  BLEEDING   1-2 WEEKS  AGO.  THEN  STOPPED   THEN ON Friday-  WAS BLEEDING -  STOPPED  SAT.      THEN SAT NIGHT  STARTED  HEAVY- THEN STOPPED   TODAY AT  4PM.         THEN  AT 0100- SHE   STARTED  CRAMPING-  NONE  NOW- FEELS  DISCOMFORT.   IN MAU -     IN B-ROOM   SAW BROWN    D/C  WHEN  WIPED       DR  Dareen PianoANDERSON  - SAW LAST 12 -2015         BUT  DEL BABY IN   W.S.    NO BIRTH  CONTROL.  LAST   SEX-  5-27

## 2015-05-21 LAB — URINE CULTURE

## 2015-06-13 IMAGING — CR DG CHEST 2V
2 series · 2 of 2 positions shown · non-contrast
Comparison: Chest radiograph performed 03/19/2013

CLINICAL DATA: Cough and chills for 1 week.

EXAM:
CHEST  2 VIEW

[w chest pa]
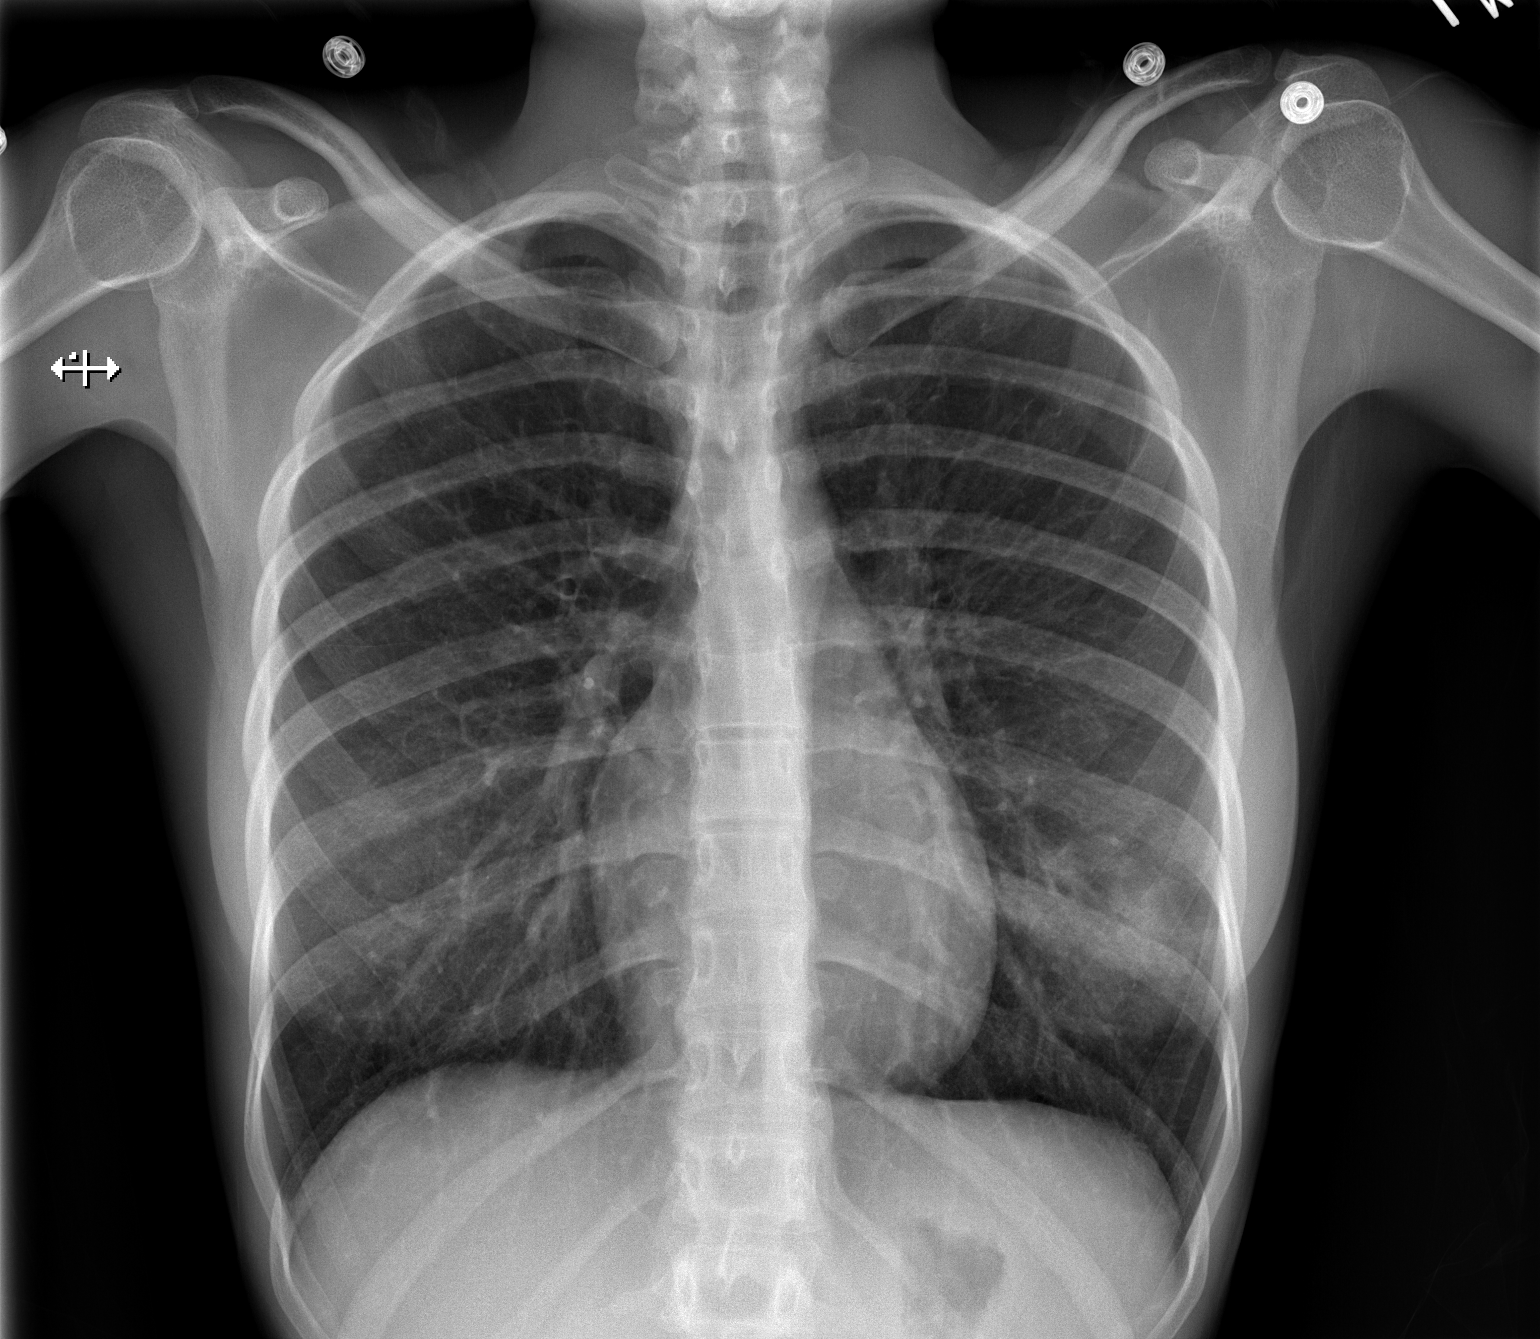

[w chest lat]
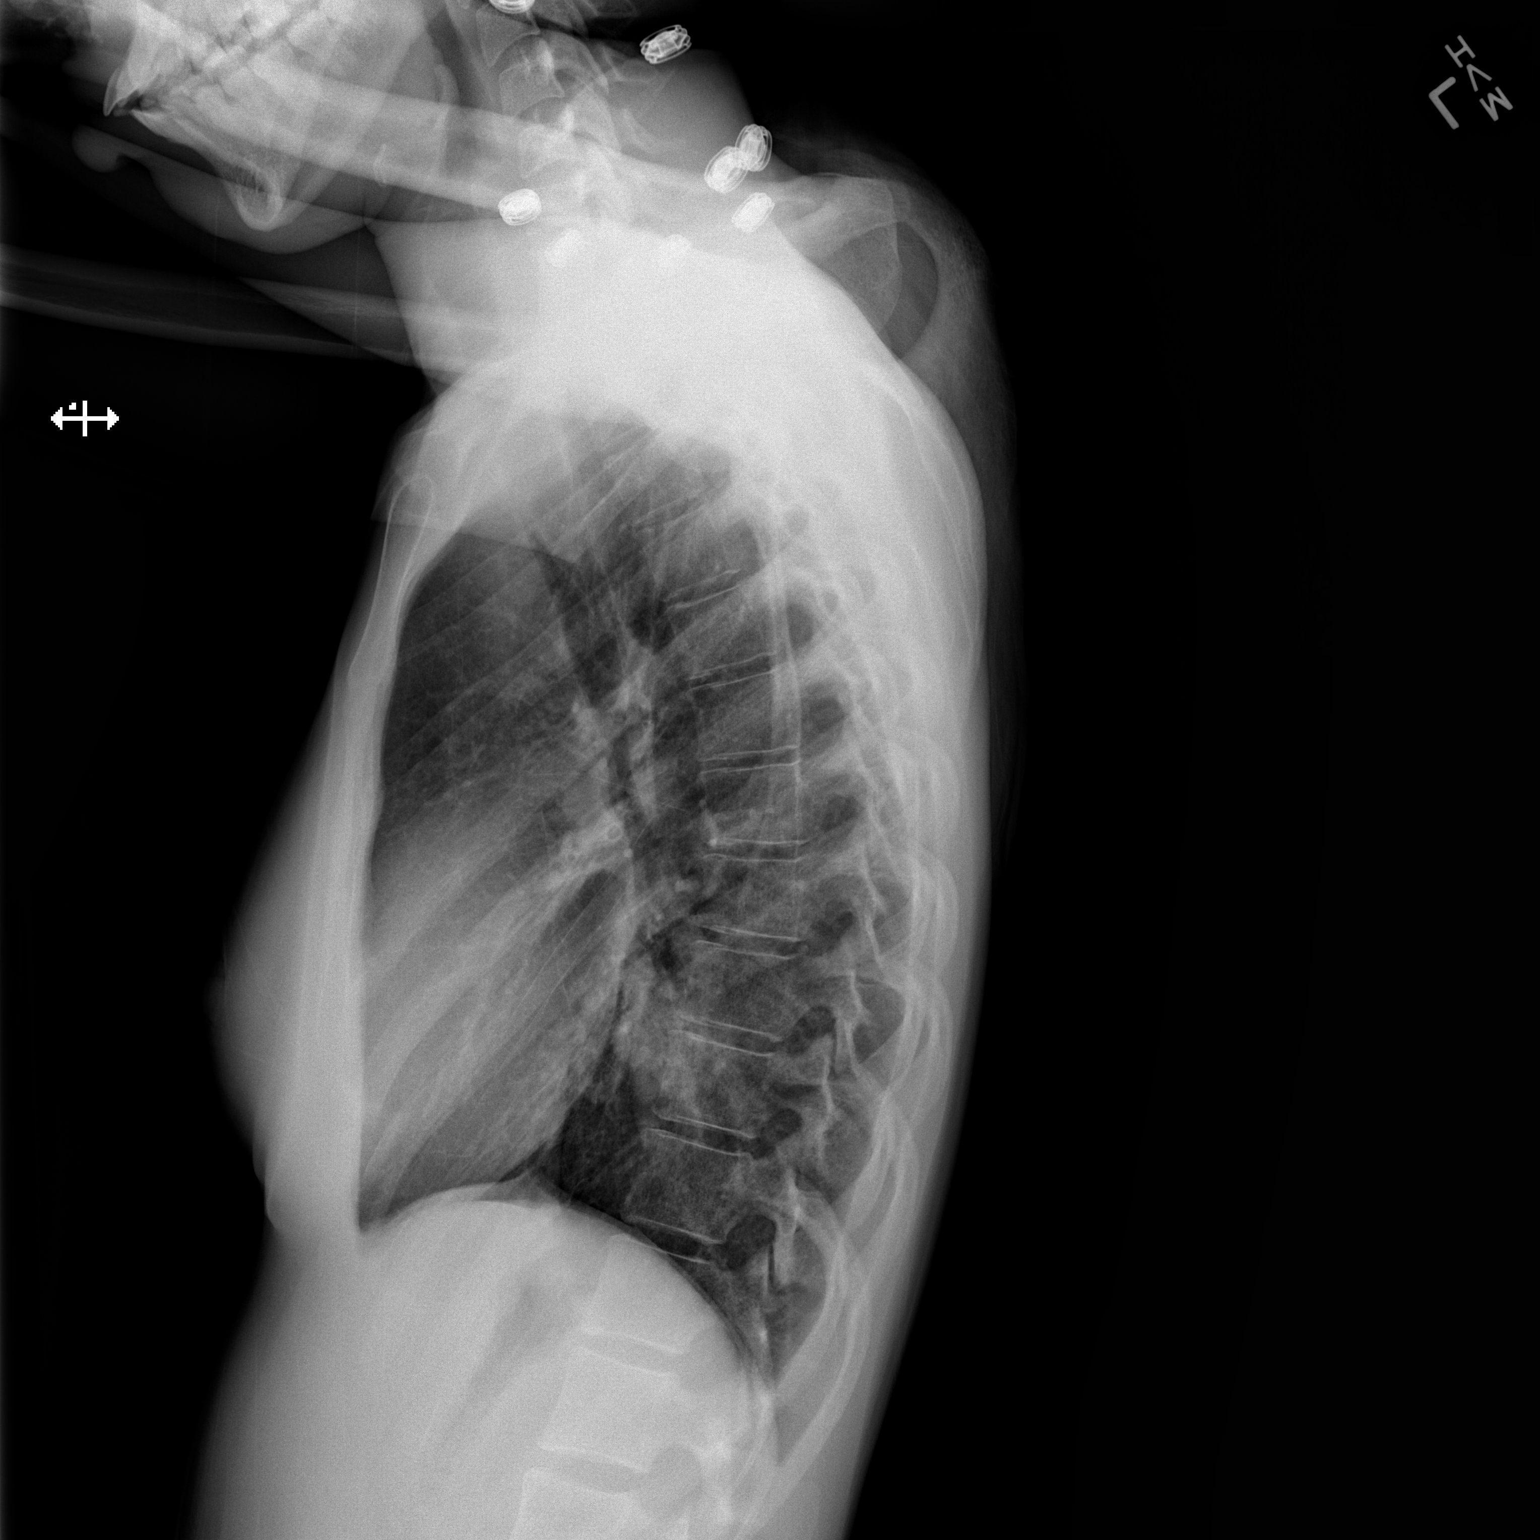

[2 of 2 positions shown; findings below may reference images not displayed]

FINDINGS: The lungs are well-aerated. Focal left lower lobe pneumonia is
noted. There is no evidence of pleural effusion or pneumothorax.

The heart is normal in size; the mediastinal contour is within
normal limits. No acute osseous abnormalities are seen.
IMPRESSION: Focal left lower lobe pneumonia noted.

## 2015-10-27 ENCOUNTER — Emergency Department (HOSPITAL_COMMUNITY)
Admission: EM | Admit: 2015-10-27 | Discharge: 2015-10-27 | Disposition: A | Discharge status: Home / Self Care | Payer: Medicaid Other | Attending: Emergency Medicine | Admitting: Emergency Medicine

## 2015-10-27 ENCOUNTER — Emergency Department (HOSPITAL_COMMUNITY): Payer: Medicaid Other

## 2015-10-27 ENCOUNTER — Encounter (HOSPITAL_COMMUNITY): Payer: Self-pay | Admitting: *Deleted

## 2015-10-27 DIAGNOSIS — M779 Enthesopathy, unspecified: Secondary | ICD-10-CM | POA: Diagnosis not present

## 2015-10-27 DIAGNOSIS — Z792 Long term (current) use of antibiotics: Secondary | ICD-10-CM | POA: Diagnosis not present

## 2015-10-27 DIAGNOSIS — F17210 Nicotine dependence, cigarettes, uncomplicated: Secondary | ICD-10-CM | POA: Diagnosis not present

## 2015-10-27 DIAGNOSIS — M79645 Pain in left finger(s): Secondary | ICD-10-CM | POA: Diagnosis present

## 2015-10-27 DIAGNOSIS — Z79899 Other long term (current) drug therapy: Secondary | ICD-10-CM | POA: Diagnosis not present

## 2015-10-27 MED ORDER — IBUPROFEN 800 MG PO TABS
800.0000 mg | ORAL_TABLET | Freq: Once | ORAL | Status: AC
  Administered 2015-10-27: 800 mg via ORAL
  Filled 2015-10-27: qty 1

## 2015-10-27 MED ORDER — NAPROXEN 500 MG PO TABS: 500.0000 mg | ORAL_TABLET | Freq: Two times a day (BID) | ORAL | Status: DC

## 2015-10-27 NOTE — ED Provider Notes (Signed)
CSN: [id number]     Arrival date & time 10/27/15  2110 History  By signing my name below, I, Ashley Stout, attest that this documentation has been prepared under the direction and in the presence of [employer] MillsBenjamin Amire Gossen, PA-C. Electronically Signed: Elon SpannerGarrett Stout ED Scribe. 10/27/2015. 10:20 PM.    Chief Complaint  Patient presents with  . Hand Pain   The history is provided by the patient. No language interpreter was used.   HPI Comments: Ashley Stout is a 24 y.o. female who presents to the Emergency Department complaining of pain at the base of the left thumb onset 3-4 days ago with worsening last night.  She reports hearing clicks and pops with certain movements and states the thumb will get stuck in certain positions.  The pain is worse with ROM and she has not tried any tx's.   She does not recall a specific injury but reports frequent use of the hand for lifting at work.  She denies fever, numbness weakness.  Past Medical History  Diagnosis Date  . No pertinent past medical history    Past Surgical History  Procedure Laterality Date  . Induced abortion     Family History  Problem Relation Age of Onset  . Cancer Mother   . Hypertension Father    Social History  Substance Use Topics  . Smoking status: Current Every Day Smoker -- 0.10 packs/day    Types: Cigarettes  . Smokeless tobacco: None  . Alcohol Use: Yes     Comment: occ   OB History    Gravida Para Term Preterm AB TAB SAB Ectopic Multiple Living   3    1 1    1      Review of Systems 10 Systems reviewed and all are negative for acute change except as noted in the HPI.  Allergies  Acetaminophen  Home Medications   Prior to Admission medications   Medication Sig Start Date End Date Taking? Authorizing Provider  metroNIDAZOLE (FLAGYL) 500 MG tablet Take 1 tablet (500 mg total) by mouth 2 (two) times daily. 05/19/15   Ashley Stout, CNM  nitrofurantoin, macrocrystal-monohydrate, (MACROBID) 100 MG capsule Take 1  capsule (100 mg total) by mouth 2 (two) times daily. 05/19/15   Ashley Stout, CNM  OVER THE COUNTER MEDICATION Take 2 capsules by mouth daily. Prenatal Gummy    Ashley Stout  terconazole (TERAZOL 7) 0.4 % vaginal cream Place 1 applicator vaginally at bedtime. 04/24/14   Ashley Stout, CNM   BP 106/78 mmHg  Pulse 74  Temp(Src) 98.7 F (37.1 C) (Oral)  Resp 18  SpO2 100%  LMP 10/25/2015  Breastfeeding? Unknown Physical Exam  Constitutional: She is oriented to person, place, and time. She appears well-developed and well-nourished. No distress.  HENT:  Head: Normocephalic and atraumatic.  Eyes: Conjunctivae and EOM are normal.  Neck: Neck supple. No tracheal deviation present.  Cardiovascular: Normal rate, regular rhythm and normal heart sounds.   Pulmonary/Chest: Effort normal. No respiratory distress.  Musculoskeletal: Normal range of motion.  Full active ROM of right wrist.  Distal pulses intact.  Brisk cap refill.  Right thumb has full passive ROM with active ROM decreased due to pain. Sensation intact to light touch. No erythema, edema or other evidence of infection.  Neurological: She is alert and oriented to person, place, and time.  Skin: Skin is warm and dry.  Psychiatric: She has a normal mood and affect. Her behavior is normal.  Nursing note  and vitals reviewed.   ED Course  Procedures (including critical care time)  DIAGNOSTIC STUDIES: Oxygen Saturation is 100% on RA, normal by my interpretation.    10:20 PM Discussed treatment plan with patient at bedside.  Patient acknowledges and agrees with plan.    COORDINATION OF CARE:  Labs Review Labs Reviewed - No data to display  Imaging Review Dg Hand Complete Right  10/27/2015  CLINICAL DATA:  Pain medially for several days. No history of recent trauma EXAM: RIGHT HAND - COMPLETE 3+ VIEW COMPARISON:  None. FINDINGS: Frontal, oblique, and lateral views were obtained. There is no demonstrable fracture or  dislocation. Joint spaces appear intact. No erosive change. IMPRESSION: No fracture or dislocation.  No appreciable arthropathy. Electronically Signed   By: Ashley Bang III M.D.   On: 10/27/2015 22:00   I have personally reviewed and evaluated these images and lab results as part of my medical decision-making.   EKG Interpretation None      Meds ordered this encounter  Medications  . ibuprofen (ADVIL,MOTRIN) tablet 800 mg    Sig:     Filed Vitals:   10/27/15 2116  BP: 106/78  Pulse: 74  Temp: 98.7 F (37.1 C)  Resp: 18     MDM  Ashley Stout is a 24 y.o. female presents for evaluation of right thumb pain. 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 Full passive range of motion on exam. Active range of motion grossly intact, slightly limited due to pain. Distal pulses are intact, neurovascularly intact. X-rays negative. Low suspicion for tenosynovitis. Symptoms more consistent with mild tendonitis. We'll give wrist brace, trial of NSAIDs. Follow-up with PCP. Return precautions given. Patient appears well, nontoxic and appropriate for discharge.  Final diagnoses:  Tendonitis    I personally performed the services described in this documentation, which was scribed in my presence. The recorded information has been reviewed and is accurate.    Ashley Peek, PA-C 10/27/15 2317  Ashley Baptist, Stout 11/04/15 [phone]

## 2015-10-27 NOTE — ED Notes (Signed)
Pt states that she is having right hand pain; pt states that the pain is from rt wrist to rt thumb; pt states that "It pops" when i use it or move; pt reports that it is difficult to grip things with her hand; pt states that she lifts a lot at work and thinks that she may have inured it there or from repeated use

## 2015-10-27 NOTE — Discharge Instructions (Signed)
Please wear your wrist brace during activity, remove at night or during times of rest. Takes your medications as prescribed. Follow up with your doctor as needed. Return to ED for any new or worsening symptoms.

## 2016-05-31 ENCOUNTER — Encounter (HOSPITAL_COMMUNITY): Payer: Self-pay | Admitting: Emergency Medicine

## 2016-05-31 DIAGNOSIS — F17210 Nicotine dependence, cigarettes, uncomplicated: Secondary | ICD-10-CM | POA: Insufficient documentation

## 2016-05-31 DIAGNOSIS — G43909 Migraine, unspecified, not intractable, without status migrainosus: Secondary | ICD-10-CM | POA: Insufficient documentation

## 2016-05-31 NOTE — ED Triage Notes (Addendum)
Pt from home with complaints or recurrent headaches, the last of which has lasted for 3 days. Pt states she has blurred vision that coincides with these headaches and blacked out about 2 months ago when she got a headache. Pt also has complaints of a stiff neck, but denies nuchal rigidity

## 2016-06-01 ENCOUNTER — Emergency Department (HOSPITAL_COMMUNITY)
Admission: EM | Admit: 2016-06-01 | Discharge: 2016-06-01 | Disposition: A | Discharge status: Home / Self Care | Payer: Medicaid Other | Attending: Emergency Medicine | Admitting: Emergency Medicine

## 2016-06-01 DIAGNOSIS — G43909 Migraine, unspecified, not intractable, without status migrainosus: Secondary | ICD-10-CM

## 2016-06-01 MED ORDER — DIPHENHYDRAMINE HCL 25 MG PO CAPS
50.0000 mg | ORAL_CAPSULE | Freq: Once | ORAL | Status: AC
  Administered 2016-06-01: 50 mg via ORAL
  Filled 2016-06-01: qty 2

## 2016-06-01 MED ORDER — METOCLOPRAMIDE HCL 10 MG PO TABS
10.0000 mg | ORAL_TABLET | Freq: Once | ORAL | Status: AC
  Administered 2016-06-01: 10 mg via ORAL
  Filled 2016-06-01: qty 1

## 2016-06-01 MED ORDER — KETOROLAC TROMETHAMINE 30 MG/ML IJ SOLN
30.0000 mg | Freq: Once | INTRAMUSCULAR | Status: AC
  Administered 2016-06-01: 30 mg via INTRAMUSCULAR
  Filled 2016-06-01: qty 1

## 2016-06-01 NOTE — ED Provider Notes (Signed)
[facility]-EMERGENCY DEPT Provider Note   CSN: [id number] Arrival date & time: 05/31/16  2239  First Provider Contact:  [phone]      History   Chief Complaint Chief Complaint  Patient presents with  . Migraine    HPI Ashley Stout is a 25 y.o. female here for evaluation of right-sided headache. Patient reports symptoms have been ongoing intermittent over the past 4 months, not relieved with Advil. Reports throbbing pain on the right side of her head, pain behind her eye. Reports intermittent paresthesias in right foot when sitting. Reports associated photophobia, phonophobia. No other fevers, chills, neck pain or stiffness, other numbness or weakness. No other modifying factors.  HPI  Past Medical History:  Diagnosis Date  . No pertinent past medical history     There are no active problems to display for this patient.   Past Surgical History:  Procedure Laterality Date  . INDUCED ABORTION      OB History    Gravida Para Term Preterm AB Living   3       1 1    SAB TAB Ectopic Multiple Live Births     1             Home Medications    Prior to Admission medications   Not on File    Family History Family History  Problem Relation Age of Onset  . Cancer Mother   . Hypertension Father     Social History Social History  Substance Use Topics  . Smoking status: Current Every Day Smoker    Packs/day: 0.10    Types: Cigarettes  . Smokeless tobacco: Never Used  . Alcohol use Yes     Comment: occ     Allergies   Acetaminophen   Review of Systems Review of Systems A 10 point review of systems was completed and was negative except for pertinent positives and negatives as mentioned in the history of present illness    Physical Exam Updated Vital Signs BP 98/57 (BP Location: Left Arm)   Pulse 77   Temp 98 F (36.7 C) (Oral)   Resp 16   SpO2 100%   Physical Exam  Constitutional: She is oriented to person, place, and time. She appears well-developed and  well-nourished. No distress.  HENT:  Head: Normocephalic and atraumatic.  Mouth/Throat: Oropharynx is clear and moist.  Eyes: Conjunctivae and EOM are normal. Pupils are equal, round, and reactive to light.  Neck: Normal range of motion.  No meningismus or nuchal rigidity  Cardiovascular: Normal rate, regular rhythm and normal heart sounds.   Pulmonary/Chest: Effort normal and breath sounds normal.  Abdominal: Soft. There is no tenderness.  Musculoskeletal: Normal range of motion. She exhibits no edema or tenderness.  Neurological: She is alert and oriented to person, place, and time.  Cranial nerves III through XII grossly intact. Motor strength is 5/5 in all 4 extremities and sensation is intact to light touch. Completes finger to nose coordination movements without difficulty. No nystagmus. Gait is baseline without ataxia.  Skin: She is not diaphoretic.     ED Treatments / Results  Labs (all labs ordered are listed, but only abnormal results are displayed) Labs Reviewed - No data to display  EKG  EKG Interpretation None       Radiology No results found.  Procedures Procedures (including critical care time)  Medications Ordered in ED Medications  ketorolac (TORADOL) 30 MG/ML injection 30 mg (30 mg Intramuscular Given 06/01/16 0755)  metoCLOPramide (REGLAN)  tablet 10 mg (10 mg Oral Given 06/01/16 0755)  diphenhydrAMINE (BENADRYL) capsule 50 mg (50 mg Oral Given 06/01/16 0755)     Initial Impression / Assessment and Plan / ED Course  I have reviewed the triage vital signs and the nursing notes.  Pertinent labs & imaging results that were available during my care of the patient were reviewed by me and considered in my medical decision making (see chart for details).  Clinical Course    Patient with reported headache ongoing for the past 4 months intermittently. She overall appears well, nontoxic, hemodynamically stable. Her physical exam is unremarkable, nonfocal neuro  exam. Walks around emergency Department in no apparent distress. No evidence of intracranial hemorrhage, meningitis or other emergent intercranial pathology. Given migraine cocktail with relief of symptoms. Upon reevaluation, she is sleeping comfortably in exam bed in no apparent distress. Given referral to neurology for further evaluation and management of symptoms. Also given PCP follow-up to patient may establish primary care. Appropriate for discharge and outpatient follow-up.  Final Clinical Impressions(s) / ED Diagnoses   Final diagnoses:  Migraine without status migrainosus, not intractable, unspecified migraine type    New Prescriptions There are no discharge medications for this patient.    Joycie Peek, PA-C 06/02/16 0630    Samuel Jester, DO 06/05/16 1213

## 2016-06-01 NOTE — Discharge Instructions (Signed)
There does not appear to be an emergent cause for your symptoms at this time. Your exam is very reassuring. He may follow up with [facility] neurology for further evaluation of your headaches. Return to ED for new or worsening symptoms as we discussed.

## 2016-06-01 NOTE — ED Notes (Signed)
Bed: WA08 Expected date:  Expected time:  Means of arrival:  Comments: 

## 2016-06-01 NOTE — ED Notes (Signed)
Pt asking for documentation that she has been here since 10 and "has not been touched' by anyone. It was explained to the patient that she received a screening in triage and this RN apologized to her about the wait. Pt sat down back in lobby. Alert, oriented, breathing even and unlabored.

## 2016-06-01 NOTE — ED Notes (Signed)
Pt

## 2017-03-11 ENCOUNTER — Emergency Department (HOSPITAL_COMMUNITY)
Admission: EM | Admit: 2017-03-11 | Discharge: 2017-03-11 | Disposition: A | Discharge status: Home / Self Care | Payer: Worker's Compensation | Attending: Emergency Medicine | Admitting: Emergency Medicine

## 2017-03-11 ENCOUNTER — Encounter (HOSPITAL_COMMUNITY): Payer: Self-pay | Admitting: Emergency Medicine

## 2017-03-11 DIAGNOSIS — Z79899 Other long term (current) drug therapy: Secondary | ICD-10-CM | POA: Insufficient documentation

## 2017-03-11 DIAGNOSIS — R21 Rash and other nonspecific skin eruption: Secondary | ICD-10-CM | POA: Diagnosis present

## 2017-03-11 DIAGNOSIS — F17210 Nicotine dependence, cigarettes, uncomplicated: Secondary | ICD-10-CM | POA: Diagnosis not present

## 2017-03-11 LAB — PREGNANCY, URINE: PREG TEST UR: NEGATIVE

## 2017-03-11 MED ORDER — DIPHENHYDRAMINE HCL 25 MG PO CAPS
50.0000 mg | ORAL_CAPSULE | Freq: Once | ORAL | Status: AC
  Administered 2017-03-11: 50 mg via ORAL
  Filled 2017-03-11: qty 2

## 2017-03-11 MED ORDER — PREDNISONE 10 MG PO TABS: 40.0000 mg | ORAL_TABLET | Freq: Every day | ORAL | 0 refills | Status: AC

## 2017-03-11 MED ORDER — DIPHENHYDRAMINE HCL 25 MG PO TABS: 25.0000 mg | ORAL_TABLET | Freq: Three times a day (TID) | ORAL | 0 refills | Status: AC | PRN

## 2017-03-11 MED ORDER — PREDNISONE 20 MG PO TABS
60.0000 mg | ORAL_TABLET | Freq: Once | ORAL | Status: AC
  Administered 2017-03-11: 60 mg via ORAL
  Filled 2017-03-11: qty 3

## 2017-03-11 NOTE — ED Provider Notes (Signed)
[facility]-EMERGENCY DEPT Provider Note   CSN: [id number] Arrival date & time: 03/11/17  1826  By signing my name below, I, Ny'Kea Lewis, attest that this documentation has been prepared under the direction and in the presence of Mathews RobinsonsJessica Disa Riedlinger, PA-C. Electronically Signed: Karren CobbleNy'Kea Lewis, ED Scribe. 03/11/17. 10:14 PM.  History   Chief Complaint Chief Complaint  Patient presents with  . Rash   The history is provided by the patient. No language interpreter was used.     HPI Comments: Ashley Stout is a 26 y.o. female with no pertinent PMHx who presents to the Emergency Department complaining of gradually worsening total body rash that started yesterday. Her associated symptoms include  itching and swelling to the areas of the rashes, which are mainly present to the extremities and buttock area. She notes her symptoms are waxing and waning and have resolved on their own but continue to return. Pt states she has washed four times today without relief of her symptoms. No other treatment tried PTA. Pt states the itching and rash started yesterday while she was at work. 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 She notes wearing jeans, long sleeves and a jacket. She denies contact with any poisonous plants in the woods or being bitten by any insects to her knowledge. Denies PMHx of eczema. She denies shortness of breathe, swelling or tightening of the throat.   Past Medical History:  Diagnosis Date  . No pertinent past medical history     There are no active problems to display for this patient.  Past Surgical History:  Procedure Laterality Date  . INDUCED ABORTION      OB History    Gravida Para Term Preterm AB Living   3       1 1    SAB TAB Ectopic Multiple Live Births     1            Home Medications    Prior to Admission medications   Medication Sig Start Date End Date Taking? Authorizing Provider  diphenhydrAMINE (BENADRYL) 25 MG tablet Take 1 tablet (25 mg  total) by mouth every 8 (eight) hours as needed for itching. 03/11/17   Georgiana ShoreMitchell, Arminta Gamm B, PA-C  predniSONE (DELTASONE) 10 MG tablet Take 4 tablets (40 mg total) by mouth daily. 03/11/17 03/15/17  Georgiana ShoreMitchell, Faythe Heitzenrater B, PA-C    Family History Family History  Problem Relation Age of Onset  . Cancer Mother   . Hypertension Father     Social History Social History  Substance Use Topics  . Smoking status: Current Every Day Smoker    Packs/day: 0.10    Types: Cigarettes  . Smokeless tobacco: Never Used  . Alcohol use Yes     Comment: occ     Allergies   Acetaminophen   Review of Systems Review of Systems  HENT: Negative for congestion.   Respiratory: Negative for shortness of breath, wheezing and stridor.   Cardiovascular: Negative for chest pain and palpitations.  Musculoskeletal: Negative for neck pain and neck stiffness.  Skin: Positive for rash. Negative for pallor.     Physical Exam Updated Vital Signs BP 139/65 (BP Location: Left Arm)   Pulse 88   Temp 99.6 F (37.6 C) (Oral)   Resp 18   Ht 5\' 4"  (1.626 m)   Wt 48.1 kg   SpO2 100%   BMI 18.19 kg/m   Physical Exam  Constitutional: She is oriented to person, place, and time. She appears well-developed  and well-nourished. No distress.  Afebrile, nontoxic-appearing, sitting comfortably in chair in no acute distress.  HENT:  Head: Normocephalic and atraumatic.  Mouth/Throat: Oropharynx is clear and moist. No oropharyngeal exudate.  Eyes: EOM are normal. Right eye exhibits no discharge. Left eye exhibits no discharge.  Neck: Normal range of motion.  Cardiovascular: Normal rate, regular rhythm and normal heart sounds.   Pulmonary/Chest: Effort normal and breath sounds normal. No stridor. No respiratory distress. She has no wheezes. She has no rales. She exhibits no tenderness.  Lymphadenopathy:    She has no cervical adenopathy.  Neurological: She is alert and oriented to person, place, and time.  Skin: Skin is warm  and dry. Rash noted. She is not diaphoretic. No erythema. No pallor.  Patient with small wheals on the upper extremities and lower extremities and buttocks.  Psychiatric: She has a normal mood and affect.  Nursing note and vitals reviewed.   ED Treatments / Results   DIAGNOSTIC STUDIES: Oxygen Saturation is 100% on RA, normal by my interpretation.   COORDINATION OF CARE: 10:00 PM-Discussed next steps with pt. Pt verbalized understanding and is agreeable with the plan.   Labs (all labs ordered are listed, but only abnormal results are displayed) Labs Reviewed  PREGNANCY, URINE    EKG  EKG Interpretation None       Radiology No results found.  Procedures Procedures (including critical care time)  Medications Ordered in ED Medications  diphenhydrAMINE (BENADRYL) capsule 50 mg (50 mg Oral Given 03/11/17 2220)  predniSONE (DELTASONE) tablet 60 mg (60 mg Oral Given 03/11/17 2222)   Discussed strict return precautions and advised to return to the emergency department if experiencing any new or worsening symptoms. Instructions were understood and patient agreed with discharge plan.   Initial Impression / Assessment and Plan / ED Course  I have reviewed the triage vital signs and the nursing notes.  Pertinent labs & imaging results that were available during my care of the patient were reviewed by me and considered in my medical decision making (see chart for details).     Patient presents with what appears to be an allergic skin reaction. Patient improved on reassessment after prednisone and Benadryl. Patient was well-appearing and nontoxic. Otherwise reassuring exam, no swelling of the throat or airway. Patient spoke in full sentences without shortness of breath. No other symptoms.  Will discharge home with close PCP follow-up for further allergy testing as needed.  Discussed strict return precautions and advised to return to the emergency department if experiencing any  new or worsening symptoms. Instructions were understood and patient agreed with discharge plan.  Final Clinical Impressions(s) / ED Diagnoses   Final diagnoses:  Rash    New Prescriptions Discharge Medication List as of 03/11/2017 11:14 PM    START taking these medications   Details  diphenhydrAMINE (BENADRYL) 25 MG tablet Take 1 tablet (25 mg total) by mouth every 8 (eight) hours as needed for itching., Starting Fri 03/11/2017, Print    predniSONE (DELTASONE) 10 MG tablet Take 4 tablets (40 mg total) by mouth daily., Starting Fri 03/11/2017, Until Tue 03/15/2017, Print       I personally performed the services described in this documentation, which was scribed in my presence. The recorded information has been reviewed and is accurate.     Georgiana Shore, PA-C 03/11/17 2345    Rolland Porter, MD 03/23/17 [phone]

## 2017-03-11 NOTE — Discharge Instructions (Signed)
As discussed, please take prednisone for the next 4 days. Benadryl as needed or Zyrtec during the day.  Follow-up with your primary care provider. Avoid the offending agent and cover your skin at work. Return to the emergency department if you experience swelling in your throat, difficulty breathing, shortness of breath, chest pain, worsening rash or any other new concerning symptoms.

## 2017-03-11 NOTE — ED Notes (Signed)
Pt reports generalized rash x 3 days with hives.  Denies using new products

## 2017-03-11 NOTE — ED Triage Notes (Signed)
Pt reports generalized body rash for the past 3 days. Has welts on skin that appear and then go away. No exposure to new skin products or allergens.

## 2018-07-23 ENCOUNTER — Encounter (HOSPITAL_COMMUNITY): Payer: Self-pay

## 2018-07-23 ENCOUNTER — Emergency Department (HOSPITAL_COMMUNITY)
Admission: EM | Admit: 2018-07-23 | Discharge: 2018-07-24 | Disposition: A | Discharge status: Home / Self Care | Payer: Self-pay | Attending: Emergency Medicine | Admitting: Emergency Medicine

## 2018-07-23 ENCOUNTER — Other Ambulatory Visit: Payer: Self-pay

## 2018-07-23 DIAGNOSIS — R519 Headache, unspecified: Secondary | ICD-10-CM

## 2018-07-23 DIAGNOSIS — F17210 Nicotine dependence, cigarettes, uncomplicated: Secondary | ICD-10-CM | POA: Insufficient documentation

## 2018-07-23 DIAGNOSIS — Z79899 Other long term (current) drug therapy: Secondary | ICD-10-CM | POA: Insufficient documentation

## 2018-07-23 DIAGNOSIS — R51 Headache: Secondary | ICD-10-CM | POA: Insufficient documentation

## 2018-07-23 NOTE — ED Triage Notes (Signed)
Pt reports headache that started yesterday. She denies N/V/D or photosensitivity. No neuro deficits.

## 2018-07-24 MED ORDER — ASPIRIN-ACETAMINOPHEN-CAFFEINE 250-250-65 MG PO TABS: 1.0000 | ORAL_TABLET | Freq: Four times a day (QID) | ORAL | 0 refills | Status: AC | PRN

## 2018-07-24 MED ORDER — ONDANSETRON 4 MG PO TBDP: 4.0000 mg | ORAL_TABLET | Freq: Three times a day (TID) | ORAL | 0 refills | Status: DC | PRN

## 2018-07-24 NOTE — ED Provider Notes (Signed)
[facility]-EMERGENCY DEPT Provider Note   CSN: [id number] Arrival date & time: 07/23/18  2132     History   Chief Complaint Chief Complaint  Patient presents with  . Headache    HPI Ashley Stout is a 27 y.o. female.  The history is provided by the patient and medical records.  Headache      27 year old female here with headache.  States started yesterday and feels like a "migraine".  States it was diffuse, throbbing pain with some light and sound sensitivity.  States while in the waiting room it spontaneously resolved.  She denies any current pain, nausea, vomiting, dizziness, numbness, weakness.  She has not had any fever or chills.  No neck stiffness.  States since she was already here she decided to wait and be seen in hopes that she could get some medication in case her headache rebounded.  Past Medical History:  Diagnosis Date  . No pertinent past medical history     There are no active problems to display for this patient.   Past Surgical History:  Procedure Laterality Date  . INDUCED ABORTION       OB History    Gravida  3   Para      Term      Preterm      AB  1   Living  1     SAB      TAB  1   Ectopic      Multiple      Live Births               Home Medications    Prior to Admission medications   Medication Sig Start Date End Date Taking? Authorizing Provider  medroxyPROGESTERone Acetate 150 MG/ML SUSY Inject 150 mg into the skin every 3 (three) months. 04/16/18  Yes [provider]  diphenhydrAMINE (BENADRYL) 25 MG tablet Take 1 tablet (25 mg total) by mouth every 8 (eight) hours as needed for itching. Patient not taking: Reported on 07/24/2018 03/11/17   Gregary CromerMitchell, Jessica B, PA-C    Family History Family History  Problem Relation Age of Onset  . Cancer Mother   . Hypertension Father     Social History Social History   Tobacco Use  . Smoking status: Current Every Day Smoker    Packs/day:  0.10    Types: Cigarettes  . Smokeless tobacco: Never Used  Substance Use Topics  . Alcohol use: Yes    Comment: occ  . Drug use: Yes    Types: Marijuana    Comment: LAST  TIME USED   - 04-2015     Allergies   Acetaminophen   Review of Systems Review of Systems  Neurological: Positive for headaches.  All other systems reviewed and are negative.    Physical Exam Updated Vital Signs BP 116/76 (BP Location: Left Arm)   Pulse 81   Temp 98.6 F (37 C) (Oral)   Resp 14   SpO2 100%   Physical Exam  Constitutional: She is oriented to person, place, and time. She appears well-developed and well-nourished. No distress.  HENT:  Head: Normocephalic and atraumatic.  Right Ear: External ear normal.  Left Ear: External ear normal.  Eyes: Pupils are equal, round, and reactive to light. Conjunctivae and EOM are normal.  Neck: Normal range of motion and full passive range of motion without pain. Neck supple. No neck rigidity.  No rigidity, no meningismus  Cardiovascular: Normal rate, regular rhythm  and normal heart sounds.  No murmur heard. Pulmonary/Chest: Effort normal and breath sounds normal. No respiratory distress. She has no wheezes. She has no rhonchi.  Abdominal: Soft. Bowel sounds are normal. There is no tenderness. There is no guarding.  Musculoskeletal: Normal range of motion. She exhibits no edema.  Neurological: She is alert and oriented to person, place, and time. She has normal strength. She displays no tremor. No cranial nerve deficit or sensory deficit. She displays no seizure activity.  AAOx3, answering questions and following commands appropriately; equal strength UE and LE bilaterally; CN grossly intact; moves all extremities appropriately without ataxia; no focal neuro deficits or facial asymmetry appreciated  Skin: Skin is warm and dry. No rash noted. She is not diaphoretic.  Psychiatric: She has a normal mood and affect. Her behavior is normal. Thought content  normal.  Nursing note and vitals reviewed.    ED Treatments / Results  Labs (all labs ordered are listed, but only abnormal results are displayed) Labs Reviewed - No data to display  EKG None  Radiology No results found.  Procedures Procedures (including critical care time)  Medications Ordered in ED Medications - No data to display   Initial Impression / Assessment and Plan / ED Course  I have reviewed the triage vital signs and the nursing notes.  Pertinent labs & imaging results that were available during my care of the patient were reviewed by me and considered in my medical decision making (see chart for details).  27 year old female here with headache.  States migraine since yesterday but spontaneously resolved while she was sitting in the waiting room.  States that she was here in the ED she decided to be seen in hopes of getting some medication if migraine recurs.  She is afebrile and nontoxic.  Neurologic exam is nonfocal.  She has no signs or symptoms suggestive of meningitis.  Recommended Excedrin Migraine should headache recur.  She was given work note at her request.  She can follow-up with her primary care doctor.  Return here for any new or worsening symptoms.  Final Clinical Impressions(s) / ED Diagnoses   Final diagnoses:  Bad headache    ED Discharge Orders         Ordered    aspirin-acetaminophen-caffeine (EXCEDRIN MIGRAINE) 250-250-65 MG tablet  Every 6 hours PRN     07/24/18 0027    ondansetron (ZOFRAN ODT) 4 MG disintegrating tablet  Every 8 hours PRN     07/24/18 0027           Garlon Hatchet, PA-C 07/24/18 0035    Gilda Crease, MD 07/24/18 [phone]

## 2018-09-07 ENCOUNTER — Ambulatory Visit: Payer: Self-pay | Admitting: Nurse Practitioner

## 2018-12-31 ENCOUNTER — Ambulatory Visit (HOSPITAL_COMMUNITY)
Admission: EM | Admit: 2018-12-31 | Discharge: 2018-12-31 | Disposition: A | Discharge status: Home / Self Care | Payer: 59 | Attending: Emergency Medicine | Admitting: Emergency Medicine

## 2018-12-31 ENCOUNTER — Encounter (HOSPITAL_COMMUNITY): Payer: Self-pay

## 2018-12-31 ENCOUNTER — Ambulatory Visit (INDEPENDENT_AMBULATORY_CARE_PROVIDER_SITE_OTHER): Payer: 59

## 2018-12-31 DIAGNOSIS — R059 Cough, unspecified: Secondary | ICD-10-CM

## 2018-12-31 DIAGNOSIS — E049 Nontoxic goiter, unspecified: Secondary | ICD-10-CM

## 2018-12-31 DIAGNOSIS — K209 Esophagitis, unspecified without bleeding: Secondary | ICD-10-CM

## 2018-12-31 DIAGNOSIS — R490 Dysphonia: Secondary | ICD-10-CM

## 2018-12-31 DIAGNOSIS — R05 Cough: Secondary | ICD-10-CM

## 2018-12-31 MED ORDER — ALUM & MAG HYDROXIDE-SIMETH 200-200-20 MG/5ML PO SUSP
ORAL | Status: AC
  Filled 2018-12-31: qty 30

## 2018-12-31 MED ORDER — ALUM & MAG HYDROXIDE-SIMETH 200-200-20 MG/5ML PO SUSP
30.0000 mL | Freq: Once | ORAL | Status: AC
  Administered 2018-12-31: 30 mL via ORAL

## 2018-12-31 MED ORDER — FAMOTIDINE 20 MG PO CHEW: 20.0000 mg | CHEWABLE_TABLET | Freq: Every day | ORAL | 0 refills | Status: DC

## 2018-12-31 MED ORDER — LIDOCAINE VISCOUS HCL 2 % MT SOLN
15.0000 mL | Freq: Once | OROMUCOSAL | Status: AC
  Administered 2018-12-31: 15 mL via ORAL

## 2018-12-31 MED ORDER — PREDNISOLONE 15 MG/5ML PO SOLN: 30.0000 mg | Freq: Every day | ORAL | 0 refills | Status: AC

## 2018-12-31 MED ORDER — SUCRALFATE 1 GM/10ML PO SUSP: 1.0000 g | Freq: Three times a day (TID) | ORAL | 0 refills | Status: DC

## 2018-12-31 MED ORDER — LIDOCAINE VISCOUS HCL 2 % MT SOLN
OROMUCOSAL | Status: AC
  Filled 2018-12-31: qty 15

## 2018-12-31 NOTE — Discharge Instructions (Addendum)
Please start famotidine daily as well as carafate 4 times a day to help with any reflux and to help with throat sensation.  Please take 5 days of prednisone to help with hoarseness and cough.  Please follow up closely with a primary care provider as you will likely need referral for further evaluation and treatment of your symptoms and possible thyroid evaluation.

## 2018-12-31 NOTE — ED Triage Notes (Signed)
Pt presents with feeling of something being stuck in throat for past few weeks.

## 2018-12-31 NOTE — ED Provider Notes (Signed)
[facility]    CSN: [id number] Arrival date & time: 12/31/18  1302     History   Chief Complaint Chief Complaint  Patient presents with  . Feels like Something Stuck in Throat    HPI Ashley Stout is a 28 y.o. female.   Ashley Stout presents with complaints of sensation of something stuck in her throat. This started yesterday after taking a multivitamin. Tried making herself vomit which did not help or produce the vitamin. She has been able to eat and drink since, with no emesis following. States she has had a hoarse voice now for months now. Was diagnosed with bronchitis in the past but did not take the medications prescribed. She has cut back smoking but does still occasionally. States she feels a sternal burning sensation since last night. Has had her cough for months. She has had some congestion. States she has had swelling to her throat for years, has had her thyroid tested by her PCP who told her it was normal. States she has had weightloss over the past two years. Her mother is hyperthyroid. No known history of asthma. No specific shortness of breath. No nausea, vomiting or diarrhea.    ROS per HPI.      Past Medical History:  Diagnosis Date  . No pertinent past medical history     There are no active problems to display for this patient.   Past Surgical History:  Procedure Laterality Date  . INDUCED ABORTION      OB History    Gravida  3   Para      Term      Preterm      AB  1   Living  1     SAB      TAB  1   Ectopic      Multiple      Live Births               Home Medications    Prior to Admission medications   Medication Sig Start Date End Date Taking? Authorizing Provider  aspirin-acetaminophen-caffeine (EXCEDRIN MIGRAINE) [phone] MG tablet Take 1 tablet by mouth every 6 (six) hours as needed for headache. 07/24/18   Garlon Hatchet, PA-C  diphenhydrAMINE (BENADRYL) 25 MG tablet Take 1 tablet (25 mg total) by mouth  every 8 (eight) hours as needed for itching. Patient not taking: Reported on 07/24/2018 03/11/17   Mathews Robinsons B, PA-C  Famotidine 20 MG CHEW Chew 1 tablet (20 mg total) by mouth daily. 12/31/18   Georgetta Haber, NP  medroxyPROGESTERone Acetate 150 MG/ML SUSY Inject 150 mg into the skin every 3 (three) months. 04/16/18   [provider]  ondansetron (ZOFRAN ODT) 4 MG disintegrating tablet Take 1 tablet (4 mg total) by mouth every 8 (eight) hours as needed for nausea. 07/24/18   Garlon Hatchet, PA-C  prednisoLONE (PRELONE) 15 MG/5ML SOLN Take 10 mLs (30 mg total) by mouth daily before breakfast for 5 days. 12/31/18 01/05/19  Georgetta Haber, NP  sucralfate (CARAFATE) 1 GM/10ML suspension Take 10 mLs (1 g total) by mouth 4 (four) times daily -  with meals and at bedtime. 12/31/18   Georgetta Haber, NP    Family History Family History  Problem Relation Age of Onset  . Cancer Mother   . Hypertension Father     Social History Social History   Tobacco Use  . Smoking status: Current Every Day Smoker  Packs/day: 0.10    Types: Cigarettes  . Smokeless tobacco: Never Used  Substance Use Topics  . Alcohol use: Yes    Comment: occ  . Drug use: Yes    Types: Marijuana    Comment: LAST  TIME USED   - 04-2015     Allergies   Acetaminophen   Review of Systems Review of Systems   Physical Exam Triage Vital Signs ED Triage Vitals  Enc Vitals Group     BP 12/31/18 1424 110/69     Pulse Rate 12/31/18 1424 68     Resp 12/31/18 1424 18     Temp 12/31/18 1424 98.3 F (36.8 C)     Temp Source 12/31/18 1424 Oral     SpO2 12/31/18 1424 98 %     Weight --      Height --      Head Circumference --      Peak Flow --      Pain Score 12/31/18 1426 3     Pain Loc --      Pain Edu? --      Excl. in GC? --    No data found.  Updated Vital Signs BP 110/69 (BP Location: Right Arm)   Pulse 68   Temp 98.3 F (36.8 C) (Oral)   Resp 18   SpO2 98%    Physical  Exam Constitutional:      General: She is not in acute distress.    Appearance: She is well-developed.  HENT:     Head: Normocephalic and atraumatic.     Right Ear: Tympanic membrane, ear canal and external ear normal.     Left Ear: Tympanic membrane, ear canal and external ear normal.     Nose: Nose normal.     Mouth/Throat:     Lips: Pink.     Mouth: Mucous membranes are moist.     Pharynx: Oropharynx is clear. Uvula midline.     Tonsils: No tonsillar exudate.  Eyes:     Conjunctiva/sclera: Conjunctivae normal.     Pupils: Pupils are equal, round, and reactive to light.  Neck:     Musculoskeletal: Normal range of motion.     Thyroid: Thyromegaly present.  Cardiovascular:     Rate and Rhythm: Normal rate and regular rhythm.     Heart sounds: Normal heart sounds.  Pulmonary:     Effort: Pulmonary effort is normal.     Breath sounds: Normal breath sounds.  Chest:     Comments: Hoarse voice noted; no increased work of breathing or shortness of breath; occasional moist cough noted  Lymphadenopathy:     Cervical: No cervical adenopathy.  Skin:    General: Skin is warm and dry.  Neurological:     Mental Status: She is alert and oriented to person, place, and time.      UC Treatments / Results  Labs (all labs ordered are listed, but only abnormal results are displayed) Labs Reviewed - No data to display  EKG None  Radiology Dg Chest 2 View  Result Date: 12/31/2018 CLINICAL DATA:  Cough EXAM: CHEST - 2 VIEW COMPARISON:  10/20/2013 FINDINGS: Heart and mediastinal contours are within normal limits. No focal opacities or effusions. No acute bony abnormality. IMPRESSION: No active cardiopulmonary disease. Electronically Signed   By: Charlett Nose M.D.   On: 12/31/2018 15:14    Procedures Procedures (including critical care time)  Medications Ordered in UC Medications  alum & mag hydroxide-simeth (MAALOX/MYLANTA) 200-200-20 MG/5ML  suspension 30 mL (30 mLs Oral Given  12/31/18 1455)    And  lidocaine (XYLOCAINE) 2 % viscous mouth solution 15 mL (15 mLs Oral Given 12/31/18 1455)    Initial Impression / Assessment and Plan / UC Course  I have reviewed the triage vital signs and the nursing notes.  Pertinent labs & imaging results that were available during my care of the patient were reviewed by me and considered in my medical decision making (see chart for details).     Patient states her pain and sensation of F/B has resolved s/p gi cocktail in clinic. Persistent cough and hoarseness. Normal chest xray. Esophagitis suspected, pepcid and carafate provided. Bronchitis vs reflux resulting in cough, 5 days of prednisone provided as well. Patient states she has had thyroid evaluation in the past, labs deferred. Encouraged close follow up with pcp as may need repeat labs/ scope/ referral for persistent symptoms. Patient verbalized understanding and agreeable to plan.   Final Clinical Impressions(s) / UC Diagnoses   Final diagnoses:  Hoarseness  Cough  Acute esophagitis  Enlarged thyroid     Discharge Instructions     Please start famotidine daily as well as carafate 4 times a day to help with any reflux and to help with throat sensation.  Please take 5 days of prednisone to help with hoarseness and cough.  Please follow up closely with a primary care provider as you will likely need referral for further evaluation and treatment of your symptoms and possible thyroid evaluation.     ED Prescriptions    Medication Sig Dispense Auth. Provider   prednisoLONE (PRELONE) 15 MG/5ML SOLN Take 10 mLs (30 mg total) by mouth daily before breakfast for 5 days. 50 mL Linus Mako B, NP   sucralfate (CARAFATE) 1 GM/10ML suspension Take 10 mLs (1 g total) by mouth 4 (four) times daily -  with meals and at bedtime. 420 mL Linus Mako B, NP   Famotidine 20 MG CHEW Chew 1 tablet (20 mg total) by mouth daily. 60 each Georgetta Haber, NP     Controlled Substance  Prescriptions [place] Controlled Substance Registry consulted? Not Applicable   Georgetta Haber, NP 12/31/18 1546

## 2019-02-17 ENCOUNTER — Encounter (HOSPITAL_COMMUNITY): Payer: Self-pay | Admitting: Emergency Medicine

## 2019-02-17 ENCOUNTER — Emergency Department (HOSPITAL_COMMUNITY)
Admission: EM | Admit: 2019-02-17 | Discharge: 2019-02-17 | Disposition: A | Discharge status: Home / Self Care | Payer: 59 | Attending: Emergency Medicine | Admitting: Emergency Medicine

## 2019-02-17 ENCOUNTER — Other Ambulatory Visit: Payer: Self-pay

## 2019-02-17 DIAGNOSIS — F17210 Nicotine dependence, cigarettes, uncomplicated: Secondary | ICD-10-CM | POA: Diagnosis not present

## 2019-02-17 DIAGNOSIS — J029 Acute pharyngitis, unspecified: Secondary | ICD-10-CM | POA: Diagnosis present

## 2019-02-17 DIAGNOSIS — Z79899 Other long term (current) drug therapy: Secondary | ICD-10-CM | POA: Diagnosis not present

## 2019-02-17 MED ORDER — DEXAMETHASONE SODIUM PHOSPHATE 10 MG/ML IJ SOLN
10.0000 mg | Freq: Once | INTRAMUSCULAR | Status: AC
  Administered 2019-02-17: 11:00:00 10 mg via INTRAMUSCULAR
  Filled 2019-02-17: qty 1

## 2019-02-17 NOTE — ED Provider Notes (Signed)
[facility]EMERGENCY DEPT Provider Note   CSN: [id number] Arrival date & time: 02/17/19  1051    History   Chief Complaint Chief Complaint  Patient presents with  . Sore Throat    HPI Ashley Stout is a 28 y.o. female.     28 year old female presents with sore throat times several days.  No fever or chills.  History of tonsillitis in the past.  Patient is able to swallow her secretions okay at this time.  Denies any vomiting or diarrhea.  Has used over-the-counter medications without relief.     Past Medical History:  Diagnosis Date  . No pertinent past medical history     There are no active problems to display for this patient.   Past Surgical History:  Procedure Laterality Date  . INDUCED ABORTION       OB History    Gravida  3   Para      Term      Preterm      AB  1   Living  1     SAB      TAB  1   Ectopic      Multiple      Live Births               Home Medications    Prior to Admission medications   Medication Sig Start Date End Date Taking? Authorizing Provider  aspirin-acetaminophen-caffeine (EXCEDRIN MIGRAINE) [phone] MG tablet Take 1 tablet by mouth every 6 (six) hours as needed for headache. 07/24/18   Garlon Hatchet, PA-C  diphenhydrAMINE (BENADRYL) 25 MG tablet Take 1 tablet (25 mg total) by mouth every 8 (eight) hours as needed for itching. Patient not taking: Reported on 07/24/2018 03/11/17   Mathews Robinsons B, PA-C  Famotidine 20 MG CHEW Chew 1 tablet (20 mg total) by mouth daily. 12/31/18   Georgetta Haber, NP  medroxyPROGESTERone Acetate 150 MG/ML SUSY Inject 150 mg into the skin every 3 (three) months. 04/16/18   [provider]  ondansetron (ZOFRAN ODT) 4 MG disintegrating tablet Take 1 tablet (4 mg total) by mouth every 8 (eight) hours as needed for nausea. 07/24/18   Garlon Hatchet, PA-C  sucralfate (CARAFATE) 1 GM/10ML suspension Take 10 mLs (1 g total) by mouth 4 (four) times daily  -  with meals and at bedtime. 12/31/18   Georgetta Haber, NP    Family History Family History  Problem Relation Age of Onset  . Cancer Mother   . Hypertension Father     Social History Social History   Tobacco Use  . Smoking status: Current Every Day Smoker    Packs/day: 0.10    Types: Cigarettes  . Smokeless tobacco: Never Used  Substance Use Topics  . Alcohol use: Yes    Comment: occ  . Drug use: Yes    Types: Marijuana    Comment: LAST  TIME USED   - 04-2015     Allergies   Acetaminophen   Review of Systems Review of Systems  All other systems reviewed and are negative.    Physical Exam Updated Vital Signs BP 121/77   Pulse 93   Temp 98.5 F (36.9 C) (Oral)   Resp 14   SpO2 100%   Physical Exam Vitals signs and nursing note reviewed.  Constitutional:      Appearance: She is well-developed. She is not toxic-appearing.  HENT:     Head: Normocephalic and atraumatic.  Mouth/Throat:     Pharynx: Posterior oropharyngeal erythema present. No oropharyngeal exudate.  Eyes:     Conjunctiva/sclera: Conjunctivae normal.     Pupils: Pupils are equal, round, and reactive to light.  Neck:     Musculoskeletal: Normal range of motion.  Cardiovascular:     Rate and Rhythm: Normal rate.  Pulmonary:     Effort: Pulmonary effort is normal.  Skin:    General: Skin is warm and dry.  Neurological:     Mental Status: She is alert and oriented to person, place, and time.      ED Treatments / Results  Labs (all labs ordered are listed, but only abnormal results are displayed) Labs Reviewed - No data to display  EKG None  Radiology No results found.  Procedures Procedures (including critical care time)  Medications Ordered in ED Medications  dexamethasone (DECADRON) injection 10 mg (has no administration in time range)     Initial Impression / Assessment and Plan / ED Course  I have reviewed the triage vital signs and the nursing notes.   Pertinent labs & imaging results that were available during my care of the patient were reviewed by me and considered in my medical decision making (see chart for details).       No signs of peritonsillar abscess Pt given decadron Has appt with ent next  Final Clinical Impressions(s) / ED Diagnoses   Final diagnoses:  None    ED Discharge Orders    None       Lorre NickAllen, Zakyria Metzinger, MD 02/17/19 1120

## 2019-02-17 NOTE — ED Triage Notes (Signed)
Patient complains of swollen tonsils that make it difficult to swallow. She has been seen for this by her MD and given medication. She reports she is running low on the medication and has not been able to take it directly as prescribed. She has an appointment to have them possibly removed on this upcoming Wednesday.

## 2019-10-09 DIAGNOSIS — E119 Type 2 diabetes mellitus without complications: Secondary | ICD-10-CM

## 2019-10-09 HISTORY — DX: Type 2 diabetes mellitus without complications: E11.9

## 2019-11-26 DIAGNOSIS — Z20828 Contact with and (suspected) exposure to other viral communicable diseases: Secondary | ICD-10-CM | POA: Diagnosis not present

## 2019-11-26 DIAGNOSIS — Z03818 Encounter for observation for suspected exposure to other biological agents ruled out: Secondary | ICD-10-CM | POA: Diagnosis not present

## 2019-12-07 DIAGNOSIS — Z03818 Encounter for observation for suspected exposure to other biological agents ruled out: Secondary | ICD-10-CM | POA: Diagnosis not present

## 2019-12-07 DIAGNOSIS — Z20828 Contact with and (suspected) exposure to other viral communicable diseases: Secondary | ICD-10-CM | POA: Diagnosis not present

## 2019-12-14 DIAGNOSIS — E1065 Type 1 diabetes mellitus with hyperglycemia: Secondary | ICD-10-CM | POA: Diagnosis not present

## 2019-12-14 DIAGNOSIS — E0590 Thyrotoxicosis, unspecified without thyrotoxic crisis or storm: Secondary | ICD-10-CM | POA: Diagnosis not present

## 2019-12-14 DIAGNOSIS — N390 Urinary tract infection, site not specified: Secondary | ICD-10-CM | POA: Diagnosis not present

## 2019-12-26 DIAGNOSIS — R748 Abnormal levels of other serum enzymes: Secondary | ICD-10-CM | POA: Diagnosis not present

## 2019-12-26 DIAGNOSIS — E1065 Type 1 diabetes mellitus with hyperglycemia: Secondary | ICD-10-CM | POA: Diagnosis not present

## 2020-03-02 DIAGNOSIS — R21 Rash and other nonspecific skin eruption: Secondary | ICD-10-CM | POA: Diagnosis not present

## 2020-03-11 ENCOUNTER — Other Ambulatory Visit: Payer: Self-pay

## 2020-03-11 ENCOUNTER — Ambulatory Visit: Payer: Federal, State, Local not specified - PPO | Admitting: Physician Assistant

## 2020-03-11 ENCOUNTER — Encounter: Payer: Self-pay | Admitting: Physician Assistant

## 2020-03-11 DIAGNOSIS — Z872 Personal history of diseases of the skin and subcutaneous tissue: Secondary | ICD-10-CM

## 2020-03-11 DIAGNOSIS — L810 Postinflammatory hyperpigmentation: Secondary | ICD-10-CM | POA: Diagnosis not present

## 2020-03-11 MED ORDER — TRIAMCINOLONE ACETONIDE 0.1 % EX OINT: 1.0000 "application " | TOPICAL_OINTMENT | Freq: Every day | CUTANEOUS | 2 refills | Status: AC

## 2020-03-11 NOTE — Progress Notes (Signed)
   New Patient   Subjective  Ashley Stout is a 29 y.o.AA  female who presents for the following: Skin Problem (hives x 1 week ago- oatmeal baths, went to urgent care- steroid shot given & hydroxyzine- hives went away- left discoloration all over body). Tried shea butter and lotion   The following portions of the chart were reviewed this encounter and updated as appropriate: Tobacco  Allergies  Meds  Problems  Med Hx  Surg Hx  Fam Hx      Objective  Well appearing patient in no apparent distress; mood and affect are within normal limits.  A full examination was performed including scalp, head, eyes, ears, nose, lips, neck, chest, axillae, abdomen, back, buttocks, bilateral upper extremities, bilateral lower extremities, hands, feet, fingers, toes, fingernails, and toenails. All findings within normal limits unless otherwise noted below.  Objective  Left Abdomen (side) - Upper, Left Lower Back (2), Left Thigh - Anterior, Mid Back, Right Abdomen (side) - Upper, Right Lower Back (2), Right Thigh - Anterior: Scattered hyperpigmentation  Assessment & Plan  Post-inflammatory hyperpigmentation (9) Left Thigh - Anterior; Right Thigh - Anterior; Left Abdomen (side) - Upper; Right Abdomen (side) - Upper; Left Lower Back (2); Right Lower Back (2); Mid Back  Ordered Medications: triamcinolone ointment (KENALOG) 0.1 %

## 2020-04-28 DIAGNOSIS — B373 Candidiasis of vulva and vagina: Secondary | ICD-10-CM | POA: Diagnosis not present

## 2020-04-28 DIAGNOSIS — Z114 Encounter for screening for human immunodeficiency virus [HIV]: Secondary | ICD-10-CM | POA: Diagnosis not present

## 2020-04-28 DIAGNOSIS — Z113 Encounter for screening for infections with a predominantly sexual mode of transmission: Secondary | ICD-10-CM | POA: Diagnosis not present

## 2020-04-28 DIAGNOSIS — Z1159 Encounter for screening for other viral diseases: Secondary | ICD-10-CM | POA: Diagnosis not present

## 2020-04-28 DIAGNOSIS — N760 Acute vaginitis: Secondary | ICD-10-CM | POA: Diagnosis not present

## 2022-04-16 DIAGNOSIS — E1065 Type 1 diabetes mellitus with hyperglycemia: Secondary | ICD-10-CM | POA: Diagnosis not present

## 2022-06-04 DIAGNOSIS — S161XXA Strain of muscle, fascia and tendon at neck level, initial encounter: Secondary | ICD-10-CM | POA: Diagnosis not present

## 2022-06-04 DIAGNOSIS — F418 Other specified anxiety disorders: Secondary | ICD-10-CM | POA: Diagnosis not present

## 2022-06-04 DIAGNOSIS — Z719 Counseling, unspecified: Secondary | ICD-10-CM | POA: Diagnosis not present

## 2022-06-05 ENCOUNTER — Ambulatory Visit: Payer: Self-pay

## 2022-06-12 DIAGNOSIS — E119 Type 2 diabetes mellitus without complications: Secondary | ICD-10-CM | POA: Diagnosis not present

## 2022-06-12 DIAGNOSIS — R1011 Right upper quadrant pain: Secondary | ICD-10-CM | POA: Diagnosis not present

## 2022-06-12 DIAGNOSIS — R410 Disorientation, unspecified: Secondary | ICD-10-CM | POA: Diagnosis not present

## 2022-06-12 DIAGNOSIS — R531 Weakness: Secondary | ICD-10-CM | POA: Diagnosis not present

## 2022-06-12 DIAGNOSIS — E1165 Type 2 diabetes mellitus with hyperglycemia: Secondary | ICD-10-CM | POA: Diagnosis not present

## 2022-06-12 DIAGNOSIS — R29818 Other symptoms and signs involving the nervous system: Secondary | ICD-10-CM | POA: Diagnosis not present

## 2022-06-12 DIAGNOSIS — R1010 Upper abdominal pain, unspecified: Secondary | ICD-10-CM | POA: Diagnosis not present

## 2022-07-20 DIAGNOSIS — E1065 Type 1 diabetes mellitus with hyperglycemia: Secondary | ICD-10-CM | POA: Diagnosis not present

## 2022-08-05 ENCOUNTER — Other Ambulatory Visit: Payer: Self-pay

## 2022-08-05 ENCOUNTER — Emergency Department (HOSPITAL_BASED_OUTPATIENT_CLINIC_OR_DEPARTMENT_OTHER): Payer: Federal, State, Local not specified - PPO

## 2022-08-05 ENCOUNTER — Emergency Department (HOSPITAL_BASED_OUTPATIENT_CLINIC_OR_DEPARTMENT_OTHER)
Admission: EM | Admit: 2022-08-05 | Discharge: 2022-08-05 | Disposition: A | Discharge status: Home / Self Care | Payer: Federal, State, Local not specified - PPO | Attending: Emergency Medicine | Admitting: Emergency Medicine

## 2022-08-05 DIAGNOSIS — R531 Weakness: Secondary | ICD-10-CM | POA: Insufficient documentation

## 2022-08-05 DIAGNOSIS — R202 Paresthesia of skin: Secondary | ICD-10-CM | POA: Diagnosis not present

## 2022-08-05 DIAGNOSIS — Z794 Long term (current) use of insulin: Secondary | ICD-10-CM | POA: Diagnosis not present

## 2022-08-05 DIAGNOSIS — M542 Cervicalgia: Secondary | ICD-10-CM | POA: Insufficient documentation

## 2022-08-05 DIAGNOSIS — Y92410 Unspecified street and highway as the place of occurrence of the external cause: Secondary | ICD-10-CM | POA: Diagnosis not present

## 2022-08-05 DIAGNOSIS — R519 Headache, unspecified: Secondary | ICD-10-CM | POA: Insufficient documentation

## 2022-08-05 DIAGNOSIS — I959 Hypotension, unspecified: Secondary | ICD-10-CM | POA: Diagnosis not present

## 2022-08-05 DIAGNOSIS — M25511 Pain in right shoulder: Secondary | ICD-10-CM | POA: Diagnosis not present

## 2022-08-05 LAB — CBG MONITORING, ED: Glucose-Capillary: 180 mg/dL — ABNORMAL HIGH (ref 70–99)

## 2022-08-05 MED ORDER — CYCLOBENZAPRINE HCL 10 MG PO TABS: 10.0000 mg | ORAL_TABLET | Freq: Two times a day (BID) | ORAL | 0 refills | Status: AC | PRN
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
## 2022-08-05 NOTE — Discharge Instructions (Signed)
You have been seen here for neck pain, I recommend taking over-the-counter pain medications like ibuprofen and/or Tylenol every 6 as needed.  Please follow dosage and on the back of bottle.  I also recommend applying heat to the area and stretching out the muscles as this will help decrease stiffness and pain.  I have given you information on exercises please follow.  Given you a muscle laxer can make you drowsy do not consume alcohol or operate heavy and she will take this medication  Symptoms not proved after 2 weeks time follow-up with your PCP   Come back to the emergency department if you develop chest pain, shortness of breath, severe abdominal pain, uncontrolled nausea, vomiting, diarrhea.

## 2022-08-12 DIAGNOSIS — S46911A Strain of unspecified muscle, fascia and tendon at shoulder and upper arm level, right arm, initial encounter: Secondary | ICD-10-CM | POA: Diagnosis not present

## 2022-08-12 DIAGNOSIS — S161XXA Strain of muscle, fascia and tendon at neck level, initial encounter: Secondary | ICD-10-CM | POA: Diagnosis not present

## 2022-08-12 DIAGNOSIS — M542 Cervicalgia: Secondary | ICD-10-CM | POA: Diagnosis not present

## 2022-08-19 DIAGNOSIS — E1065 Type 1 diabetes mellitus with hyperglycemia: Secondary | ICD-10-CM | POA: Diagnosis not present

## 2022-09-01 DIAGNOSIS — M9908 Segmental and somatic dysfunction of rib cage: Secondary | ICD-10-CM | POA: Diagnosis not present

## 2022-09-01 DIAGNOSIS — S161XXA Strain of muscle, fascia and tendon at neck level, initial encounter: Secondary | ICD-10-CM | POA: Diagnosis not present

## 2022-09-01 DIAGNOSIS — M9901 Segmental and somatic dysfunction of cervical region: Secondary | ICD-10-CM | POA: Diagnosis not present

## 2022-09-01 DIAGNOSIS — M9902 Segmental and somatic dysfunction of thoracic region: Secondary | ICD-10-CM | POA: Diagnosis not present

## 2022-09-02 DIAGNOSIS — M9908 Segmental and somatic dysfunction of rib cage: Secondary | ICD-10-CM | POA: Diagnosis not present

## 2022-09-02 DIAGNOSIS — S161XXA Strain of muscle, fascia and tendon at neck level, initial encounter: Secondary | ICD-10-CM | POA: Diagnosis not present

## 2022-09-02 DIAGNOSIS — M9901 Segmental and somatic dysfunction of cervical region: Secondary | ICD-10-CM | POA: Diagnosis not present

## 2022-09-02 DIAGNOSIS — M9902 Segmental and somatic dysfunction of thoracic region: Secondary | ICD-10-CM | POA: Diagnosis not present

## 2022-09-06 DIAGNOSIS — M9908 Segmental and somatic dysfunction of rib cage: Secondary | ICD-10-CM | POA: Diagnosis not present

## 2022-09-06 DIAGNOSIS — M9901 Segmental and somatic dysfunction of cervical region: Secondary | ICD-10-CM | POA: Diagnosis not present

## 2022-09-06 DIAGNOSIS — S161XXA Strain of muscle, fascia and tendon at neck level, initial encounter: Secondary | ICD-10-CM | POA: Diagnosis not present

## 2022-09-06 DIAGNOSIS — M9902 Segmental and somatic dysfunction of thoracic region: Secondary | ICD-10-CM | POA: Diagnosis not present

## 2022-09-07 DIAGNOSIS — M9902 Segmental and somatic dysfunction of thoracic region: Secondary | ICD-10-CM | POA: Diagnosis not present

## 2022-09-07 DIAGNOSIS — S161XXA Strain of muscle, fascia and tendon at neck level, initial encounter: Secondary | ICD-10-CM | POA: Diagnosis not present

## 2022-09-07 DIAGNOSIS — M9901 Segmental and somatic dysfunction of cervical region: Secondary | ICD-10-CM | POA: Diagnosis not present

## 2022-09-07 DIAGNOSIS — M9908 Segmental and somatic dysfunction of rib cage: Secondary | ICD-10-CM | POA: Diagnosis not present

## 2022-09-09 DIAGNOSIS — M9901 Segmental and somatic dysfunction of cervical region: Secondary | ICD-10-CM | POA: Diagnosis not present

## 2022-09-09 DIAGNOSIS — M9908 Segmental and somatic dysfunction of rib cage: Secondary | ICD-10-CM | POA: Diagnosis not present

## 2022-09-09 DIAGNOSIS — M9902 Segmental and somatic dysfunction of thoracic region: Secondary | ICD-10-CM | POA: Diagnosis not present

## 2022-09-09 DIAGNOSIS — S161XXA Strain of muscle, fascia and tendon at neck level, initial encounter: Secondary | ICD-10-CM | POA: Diagnosis not present

## 2022-09-14 DIAGNOSIS — M9908 Segmental and somatic dysfunction of rib cage: Secondary | ICD-10-CM | POA: Diagnosis not present

## 2022-09-14 DIAGNOSIS — S161XXA Strain of muscle, fascia and tendon at neck level, initial encounter: Secondary | ICD-10-CM | POA: Diagnosis not present

## 2022-09-14 DIAGNOSIS — M9902 Segmental and somatic dysfunction of thoracic region: Secondary | ICD-10-CM | POA: Diagnosis not present

## 2022-09-14 DIAGNOSIS — M9901 Segmental and somatic dysfunction of cervical region: Secondary | ICD-10-CM | POA: Diagnosis not present

## 2022-09-15 DIAGNOSIS — M9901 Segmental and somatic dysfunction of cervical region: Secondary | ICD-10-CM | POA: Diagnosis not present

## 2022-09-15 DIAGNOSIS — S161XXA Strain of muscle, fascia and tendon at neck level, initial encounter: Secondary | ICD-10-CM | POA: Diagnosis not present

## 2022-09-15 DIAGNOSIS — M9902 Segmental and somatic dysfunction of thoracic region: Secondary | ICD-10-CM | POA: Diagnosis not present

## 2022-09-15 DIAGNOSIS — M9908 Segmental and somatic dysfunction of rib cage: Secondary | ICD-10-CM | POA: Diagnosis not present

## 2022-09-20 DIAGNOSIS — M9902 Segmental and somatic dysfunction of thoracic region: Secondary | ICD-10-CM | POA: Diagnosis not present

## 2022-09-20 DIAGNOSIS — M9908 Segmental and somatic dysfunction of rib cage: Secondary | ICD-10-CM | POA: Diagnosis not present

## 2022-09-20 DIAGNOSIS — M9901 Segmental and somatic dysfunction of cervical region: Secondary | ICD-10-CM | POA: Diagnosis not present

## 2022-09-20 DIAGNOSIS — S161XXA Strain of muscle, fascia and tendon at neck level, initial encounter: Secondary | ICD-10-CM | POA: Diagnosis not present

## 2022-09-21 DIAGNOSIS — M9902 Segmental and somatic dysfunction of thoracic region: Secondary | ICD-10-CM | POA: Diagnosis not present

## 2022-09-21 DIAGNOSIS — S161XXA Strain of muscle, fascia and tendon at neck level, initial encounter: Secondary | ICD-10-CM | POA: Diagnosis not present

## 2022-09-21 DIAGNOSIS — M9908 Segmental and somatic dysfunction of rib cage: Secondary | ICD-10-CM | POA: Diagnosis not present

## 2022-09-21 DIAGNOSIS — M9901 Segmental and somatic dysfunction of cervical region: Secondary | ICD-10-CM | POA: Diagnosis not present

## 2022-10-13 DIAGNOSIS — M9908 Segmental and somatic dysfunction of rib cage: Secondary | ICD-10-CM | POA: Diagnosis not present

## 2022-10-13 DIAGNOSIS — M9902 Segmental and somatic dysfunction of thoracic region: Secondary | ICD-10-CM | POA: Diagnosis not present

## 2022-10-13 DIAGNOSIS — M9901 Segmental and somatic dysfunction of cervical region: Secondary | ICD-10-CM | POA: Diagnosis not present

## 2022-10-13 DIAGNOSIS — S161XXA Strain of muscle, fascia and tendon at neck level, initial encounter: Secondary | ICD-10-CM | POA: Diagnosis not present

## 2022-10-26 DIAGNOSIS — J019 Acute sinusitis, unspecified: Secondary | ICD-10-CM | POA: Diagnosis not present

## 2022-10-26 DIAGNOSIS — Z20822 Contact with and (suspected) exposure to covid-19: Secondary | ICD-10-CM | POA: Diagnosis not present

## 2022-11-04 DIAGNOSIS — M9901 Segmental and somatic dysfunction of cervical region: Secondary | ICD-10-CM | POA: Diagnosis not present

## 2022-11-04 DIAGNOSIS — M9902 Segmental and somatic dysfunction of thoracic region: Secondary | ICD-10-CM | POA: Diagnosis not present

## 2022-11-04 DIAGNOSIS — M9908 Segmental and somatic dysfunction of rib cage: Secondary | ICD-10-CM | POA: Diagnosis not present

## 2022-11-04 DIAGNOSIS — S161XXA Strain of muscle, fascia and tendon at neck level, initial encounter: Secondary | ICD-10-CM | POA: Diagnosis not present

## 2022-11-30 DIAGNOSIS — E1065 Type 1 diabetes mellitus with hyperglycemia: Secondary | ICD-10-CM | POA: Diagnosis not present

## 2022-12-14 DIAGNOSIS — Z118 Encounter for screening for other infectious and parasitic diseases: Secondary | ICD-10-CM | POA: Diagnosis not present

## 2022-12-14 DIAGNOSIS — N760 Acute vaginitis: Secondary | ICD-10-CM | POA: Diagnosis not present

## 2022-12-14 DIAGNOSIS — N898 Other specified noninflammatory disorders of vagina: Secondary | ICD-10-CM | POA: Diagnosis not present

## 2022-12-14 DIAGNOSIS — Z113 Encounter for screening for infections with a predominantly sexual mode of transmission: Secondary | ICD-10-CM | POA: Diagnosis not present

## 2023-04-21 DIAGNOSIS — E109 Type 1 diabetes mellitus without complications: Secondary | ICD-10-CM | POA: Diagnosis not present

## 2023-04-21 DIAGNOSIS — H1131 Conjunctival hemorrhage, right eye: Secondary | ICD-10-CM | POA: Diagnosis not present

## 2023-05-30 DIAGNOSIS — E559 Vitamin D deficiency, unspecified: Secondary | ICD-10-CM | POA: Diagnosis not present

## 2023-05-30 DIAGNOSIS — E1065 Type 1 diabetes mellitus with hyperglycemia: Secondary | ICD-10-CM | POA: Diagnosis not present

## 2023-05-30 DIAGNOSIS — E0590 Thyrotoxicosis, unspecified without thyrotoxic crisis or storm: Secondary | ICD-10-CM | POA: Diagnosis not present

## 2023-05-31 DIAGNOSIS — R946 Abnormal results of thyroid function studies: Secondary | ICD-10-CM | POA: Diagnosis not present

## 2023-05-31 DIAGNOSIS — E559 Vitamin D deficiency, unspecified: Secondary | ICD-10-CM | POA: Diagnosis not present

## 2023-05-31 DIAGNOSIS — E10649 Type 1 diabetes mellitus with hypoglycemia without coma: Secondary | ICD-10-CM | POA: Diagnosis not present

## 2023-05-31 DIAGNOSIS — E1065 Type 1 diabetes mellitus with hyperglycemia: Secondary | ICD-10-CM | POA: Diagnosis not present

## 2023-06-20 DIAGNOSIS — E1065 Type 1 diabetes mellitus with hyperglycemia: Secondary | ICD-10-CM | POA: Diagnosis not present

## 2023-07-19 DIAGNOSIS — R102 Pelvic and perineal pain: Secondary | ICD-10-CM | POA: Diagnosis not present

## 2023-07-19 DIAGNOSIS — N760 Acute vaginitis: Secondary | ICD-10-CM | POA: Diagnosis not present

## 2023-07-19 DIAGNOSIS — R350 Frequency of micturition: Secondary | ICD-10-CM | POA: Diagnosis not present

## 2023-09-08 DIAGNOSIS — L818 Other specified disorders of pigmentation: Secondary | ICD-10-CM | POA: Diagnosis not present

## 2023-09-08 DIAGNOSIS — L700 Acne vulgaris: Secondary | ICD-10-CM | POA: Diagnosis not present

## 2023-09-17 ENCOUNTER — Ambulatory Visit
Admission: EM | Admit: 2023-09-17 | Discharge: 2023-09-17 | Disposition: A | Discharge status: Home / Self Care | Payer: Federal, State, Local not specified - PPO | Attending: Internal Medicine | Admitting: Internal Medicine

## 2023-09-17 DIAGNOSIS — R519 Headache, unspecified: Secondary | ICD-10-CM

## 2023-09-17 NOTE — ED Triage Notes (Signed)
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 Pt states work asked for a note from a healthcare. States the headache is better already after she left the office.

## 2023-09-17 NOTE — ED Provider Notes (Signed)
[facility]  Note:  This document was prepared using Conservation officer, historic buildings and may include unintentional dictation errors.  MRN: [id number] DOB: Sep 20, 1991  Subjective:   Ashley Stout is a 32 y.o. female presenting for needing an excuse for work.  Patient had a headache earlier today while at work.  She was unable to perform her work tasks very well and thinks it was related to a lot of noise from the construction, smoke in her vicinity.  She had to leave from work and since she left, her headache resolved.  No confusion, vision changes, weakness, numbness or tingling, diaphoresis, nausea, vomiting.  No current facility-administered medications for this encounter.  Current Outpatient Medications:    aspirin-acetaminophen-caffeine (EXCEDRIN MIGRAINE) 250-250-65 MG tablet, Take 1 tablet by mouth every 6 (six) hours as needed for headache. (Patient not taking: Reported on 03/11/2020), Disp: 30 tablet, Rfl: 0   cyclobenzaprine (FLEXERIL) 10 MG tablet, Take 1 tablet (10 mg total) by mouth 2 (two) times daily as needed for muscle spasms., Disp: 20 tablet, Rfl: 0   diphenhydrAMINE (BENADRYL) 25 MG tablet, Take 1 tablet (25 mg total) by mouth every 8 (eight) hours as needed for itching. (Patient not taking: Reported on 07/24/2018), Disp: 20 tablet, Rfl: 0   Insulin Degludec (TRESIBA) 100 UNIT/ML SOLN, Inject into the skin., Disp: , Rfl:    insulin regular (NOVOLIN R) 100 units/mL injection, Inject into the skin 3 (three) times daily before meals., Disp: , Rfl:    triamcinolone ointment (KENALOG) 0.1 %, Apply 1 application topically at bedtime., Disp: 454 g, Rfl: 2   Allergies  Allergen Reactions   Acetaminophen Nausea And Vomiting    Past Medical History:  Diagnosis Date   Diabetes (HCC) 10/2019     Past Surgical History:  Procedure Laterality Date   INDUCED ABORTION      Family History  Problem Relation Age of Onset   Cancer Mother    Hypertension  Father     Social History   Tobacco Use   Smoking status: Every Day    Current packs/day: 0.10    Types: Cigarettes   Smokeless tobacco: Never  Vaping Use   Vaping status: Never Used  Substance Use Topics   Alcohol use: Not Currently    Comment: occ   Drug use: Not Currently    Types: Marijuana    Comment: pt denies    ROS   Objective:   Vitals: BP 117/75 (BP Location: Right Arm)   Pulse 94   Temp 99 F (37.2 C) (Oral)   Resp 16   LMP 09/14/2023 (Exact Date)   SpO2 98%   Breastfeeding No   Physical Exam Constitutional:      General: She is not in acute distress.    Appearance: Normal appearance. She is well-developed and normal weight. She is not ill-appearing, toxic-appearing or diaphoretic.  HENT:     Head: Normocephalic and atraumatic.     Right Ear: Tympanic membrane, ear canal and external ear normal. No drainage or tenderness. No middle ear effusion. There is no impacted cerumen. Tympanic membrane is not erythematous or bulging.     Left Ear: Tympanic membrane, ear canal and external ear normal. No drainage or tenderness.  No middle ear effusion. There is no impacted cerumen. Tympanic membrane is not erythematous or bulging.     Nose: Nose normal. No congestion or rhinorrhea.     Mouth/Throat:     Mouth: Mucous membranes are moist.  No oral lesions.     Pharynx: No pharyngeal swelling, oropharyngeal exudate, posterior oropharyngeal erythema or uvula swelling.     Tonsils: No tonsillar exudate or tonsillar abscesses.  Eyes:     General: No scleral icterus.       Right eye: No discharge.        Left eye: No discharge.     Extraocular Movements: Extraocular movements intact.     Right eye: Normal extraocular motion.     Left eye: Normal extraocular motion.     Conjunctiva/sclera: Conjunctivae normal.  Neck:     Meningeal: Brudzinski's sign and Kernig's sign absent.  Cardiovascular:     Rate and Rhythm: Normal rate.  Pulmonary:     Effort: Pulmonary  effort is normal.  Musculoskeletal:     Cervical back: Normal range of motion and neck supple.  Lymphadenopathy:     Cervical: No cervical adenopathy.  Skin:    General: Skin is warm and dry.  Neurological:     General: No focal deficit present.     Mental Status: She is alert and oriented to person, place, and time.     Cranial Nerves: No cranial nerve deficit, dysarthria or facial asymmetry.     Motor: No weakness or pronator drift.     Coordination: Romberg sign negative. Coordination normal. Finger-Nose-Finger Test and Heel to [facility] Test normal. Rapid alternating movements normal.     Gait: Gait and tandem walk normal.     Deep Tendon Reflexes: Reflexes normal.  Psychiatric:        Mood and Affect: Mood normal.        Behavior: Behavior normal.        Thought Content: Thought content normal.        Judgment: Judgment normal.     Assessment and Plan :   PDMP not reviewed this encounter.  1. Generalized headache    Headache is resolved, reassuring neurologic exam.  Provided with documentation for work.  Follow-up as needed.  Counseled patient on potential for adverse effects with medications prescribed/recommended today, ER and return-to-clinic precautions discussed, patient verbalized understanding.    Wallis Bamberg, [place] 09/18/23 [phone]

## 2023-12-08 DIAGNOSIS — B3731 Acute candidiasis of vulva and vagina: Secondary | ICD-10-CM | POA: Diagnosis not present

## 2023-12-08 DIAGNOSIS — R300 Dysuria: Secondary | ICD-10-CM | POA: Diagnosis not present

## 2023-12-21 DIAGNOSIS — L700 Acne vulgaris: Secondary | ICD-10-CM | POA: Diagnosis not present

## 2024-01-05 ENCOUNTER — Other Ambulatory Visit: Payer: Self-pay

## 2024-01-05 ENCOUNTER — Ambulatory Visit
Admission: EM | Admit: 2024-01-05 | Discharge: 2024-01-05 | Disposition: A | Discharge status: Home / Self Care | Payer: Federal, State, Local not specified - PPO | Attending: Family Medicine | Admitting: Family Medicine

## 2024-01-05 ENCOUNTER — Encounter: Payer: Self-pay | Admitting: Emergency Medicine

## 2024-01-05 DIAGNOSIS — R739 Hyperglycemia, unspecified: Secondary | ICD-10-CM

## 2024-01-05 DIAGNOSIS — E1069 Type 1 diabetes mellitus with other specified complication: Secondary | ICD-10-CM

## 2024-01-05 LAB — POCT URINALYSIS DIP (MANUAL ENTRY)
Bilirubin, UA: NEGATIVE
Blood, UA: NEGATIVE
Glucose, UA: >=1000 mg/dL — AB
Ketones, POC UA: NEGATIVE mg/dL
Leukocytes, UA: NEGATIVE
Nitrite, UA: NEGATIVE
Protein Ur, POC: NEGATIVE mg/dL
Spec Grav, UA: 1.01 (ref 1.010–1.025)
Urobilinogen, UA: 0.2 U/dL
pH, UA: 5.5 (ref 5.0–8.0)

## 2024-01-05 LAB — POCT URINE PREGNANCY: Preg Test, Ur: NEGATIVE

## 2024-01-05 LAB — POCT FASTING CBG KUC MANUAL ENTRY: POCT Glucose (KUC): 461 mg/dL — AB (ref 70–99)

## 2024-01-05 NOTE — Discharge Instructions (Signed)
 Please go to the emergency room for further treatment and evaluation of your symptoms

## 2024-01-05 NOTE — ED Triage Notes (Signed)
 Pt states she is been having urinary frequency for the past few days and increase fatigue. States she is diabetic and she is not been able to work for the past 3 days.

## 2024-01-05 NOTE — ED Provider Notes (Signed)
 [facility]    CSN: [id number] Arrival date & time: 01/05/24  1509      History   Chief Complaint Chief Complaint  Patient presents with   Urinary Frequency   Fatigue    HPI Ashley Stout is a 33 y.o. female presents for urinary frequency.  Patient has a history of type 1 diabetes with hyperglycemia.  States her blood sugars fluctuate dramatically from the 60s to over 400s.  Over the past 3 days she has been having urinary frequency with increasing fatigue and achiness.  Denies any burning with urination, urgency, hematuria, fevers, nausea/vomiting, flank pain.  No URI symptoms.  She did try to contact her endocrinologist to get outpatient blood work to "check her kidneys".  Per the telephone encounter it appears they referred her to the emergency room to rule out DKA given her longstanding hyperglycemia.  Patient does have a Dexcom but self administers insulin does not have an insulin pump.  States she took 4 units prior to arrival after eating oatmeal.  She has never been in DKA before but states she does not usually feel like this when her sugars are this high.  States she is been staying hydrated.  No other concerns at this time.   Urinary Frequency    Past Medical History:  Diagnosis Date   Diabetes (HCC) 10/2019    There are no active problems to display for this patient.   Past Surgical History:  Procedure Laterality Date   INDUCED ABORTION      OB History     Gravida  3   Para      Term      Preterm      AB  1   Living  1      SAB      IAB  1   Ectopic      Multiple      Live Births               Home Medications    Prior to Admission medications   Medication Sig Start Date End Date Taking? Authorizing Provider  aspirin-acetaminophen-caffeine (EXCEDRIN MIGRAINE) [phone] MG tablet Take 1 tablet by mouth every 6 (six) hours as needed for headache. Patient not taking: Reported on 03/11/2020 07/24/18   Garlon Hatchet, PA-C   cyclobenzaprine (FLEXERIL) 10 MG tablet Take 1 tablet (10 mg total) by mouth 2 (two) times daily as needed for muscle spasms. 08/05/22   Carroll Sage, PA-C  diphenhydrAMINE (BENADRYL) 25 MG tablet Take 1 tablet (25 mg total) by mouth every 8 (eight) hours as needed for itching. Patient not taking: Reported on 07/24/2018 03/11/17   Georgiana Shore, PA-C  Insulin Degludec (TRESIBA) 100 UNIT/ML SOLN Inject into the skin.    [provider]  insulin regular (NOVOLIN R) 100 units/mL injection Inject into the skin 3 (three) times daily before meals.    [provider]  triamcinolone ointment (KENALOG) 0.1 % Apply 1 application topically at bedtime. 03/11/20   Glyn Ade, PA-C    Family History Family History  Problem Relation Age of Onset   Cancer Mother    Hypertension Father     Social History Social History   Tobacco Use   Smoking status: Every Day    Current packs/day: 0.10    Types: Cigarettes   Smokeless tobacco: Never  Vaping Use   Vaping status: Never Used  Substance Use Topics   Alcohol use: Not Currently  Comment: occ   Drug use: Not Currently    Types: Marijuana    Comment: pt denies     Allergies   Acetaminophen   Review of Systems Review of Systems  Constitutional:  Positive for fatigue.  Genitourinary:  Positive for frequency.     Physical Exam Triage Vital Signs ED Triage Vitals [01/05/24 1533]  Encounter Vitals Group     BP 102/69     Systolic BP Percentile      Diastolic BP Percentile      Pulse Rate 76     Resp 16     Temp 99 F (37.2 C)     Temp Source Oral     SpO2 99 %     Weight      Height      Head Circumference      Peak Flow      Pain Score 0     Pain Loc      Pain Education      Exclude from Growth Chart    No data found.  Updated Vital Signs BP 102/69 (BP Location: Right Arm)   Pulse 76   Temp 99 F (37.2 C) (Oral)   Resp 16   LMP 12/27/2023 (Exact Date)   SpO2 99%   Visual  Acuity Right Eye Distance:   Left Eye Distance:   Bilateral Distance:    Right Eye Near:   Left Eye Near:    Bilateral Near:     Physical Exam Vitals and nursing note reviewed.  Constitutional:      Comments: Patient appears fatigued with large bags under her eyes and does smell of marijuana  HENT:     Head: Normocephalic and atraumatic.  Eyes:     Pupils: Pupils are equal, round, and reactive to light.  Cardiovascular:     Rate and Rhythm: Normal rate.  Pulmonary:     Effort: Pulmonary effort is normal.  Abdominal:     Tenderness: There is no right CVA tenderness or left CVA tenderness.  Skin:    General: Skin is warm and dry.  Neurological:     General: No focal deficit present.     Mental Status: She is alert and oriented to person, place, and time.  Psychiatric:        Mood and Affect: Mood normal.        Behavior: Behavior normal.      UC Treatments / Results  Labs (all labs ordered are listed, but only abnormal results are displayed) Labs Reviewed  POCT URINALYSIS DIP (MANUAL ENTRY) - Abnormal; Notable for the following components:      Result Value   Glucose, UA >=1,000 (*)    All other components within normal limits  POCT FASTING CBG [facility] MANUAL ENTRY - Abnormal; Notable for the following components:   POCT Glucose ([facility]) 461 (*)    All other components within normal limits  POCT URINE PREGNANCY    EKG   Radiology No results found.  Procedures Procedures (including critical care time)  Medications Ordered in UC Medications - No data to display  Initial Impression / Assessment and Plan / UC Course  I have reviewed the triage vital signs and the nursing notes.  Pertinent labs & imaging results that were available during my care of the patient were reviewed by me and considered in my medical decision making (see chart for details).     Reviewed symptoms and labs with patient.  Fingerstick glucose was 461  on fasting.  UA does not show ketones or  protein, however, given her symptoms I did advise she go to the ER for further workup and evaluation.  She is reluctant but is in agreement with plan and will go to the ER for further evaluation. Final Clinical Impressions(s) / UC Diagnoses   Final diagnoses:  Type 1 diabetes mellitus with other specified complication (HCC)  Hyperglycemia     Discharge Instructions      Please go to the emergency room for further treatment and evaluation of your symptoms    ED Prescriptions   None    PDMP not reviewed this encounter.   Radford Pax, NP 01/05/24 [phone]

## 2024-01-06 ENCOUNTER — Encounter (HOSPITAL_BASED_OUTPATIENT_CLINIC_OR_DEPARTMENT_OTHER): Payer: Self-pay | Admitting: Urology

## 2024-01-06 ENCOUNTER — Other Ambulatory Visit: Payer: Self-pay

## 2024-01-06 ENCOUNTER — Emergency Department (HOSPITAL_BASED_OUTPATIENT_CLINIC_OR_DEPARTMENT_OTHER)
Admission: EM | Admit: 2024-01-06 | Discharge: 2024-01-06 | Disposition: A | Discharge status: Home / Self Care | Payer: Federal, State, Local not specified - PPO | Attending: Emergency Medicine | Admitting: Emergency Medicine

## 2024-01-06 DIAGNOSIS — F17210 Nicotine dependence, cigarettes, uncomplicated: Secondary | ICD-10-CM | POA: Insufficient documentation

## 2024-01-06 DIAGNOSIS — Z79899 Other long term (current) drug therapy: Secondary | ICD-10-CM | POA: Diagnosis not present

## 2024-01-06 DIAGNOSIS — E1165 Type 2 diabetes mellitus with hyperglycemia: Secondary | ICD-10-CM | POA: Diagnosis not present

## 2024-01-06 DIAGNOSIS — Z794 Long term (current) use of insulin: Secondary | ICD-10-CM | POA: Diagnosis not present

## 2024-01-06 DIAGNOSIS — Z7982 Long term (current) use of aspirin: Secondary | ICD-10-CM | POA: Diagnosis not present

## 2024-01-06 DIAGNOSIS — R739 Hyperglycemia, unspecified: Secondary | ICD-10-CM | POA: Diagnosis not present

## 2024-01-06 LAB — URINALYSIS, ROUTINE W REFLEX MICROSCOPIC
Bilirubin Urine: NEGATIVE
Glucose, UA: >=500 mg/dL — AB
Hgb urine dipstick: NEGATIVE
Ketones, ur: NEGATIVE mg/dL
Leukocytes,Ua: NEGATIVE
Nitrite: NEGATIVE
Protein, ur: NEGATIVE mg/dL
Specific Gravity, Urine: 1.015 (ref 1.005–1.030)
pH: 6.5 (ref 5.0–8.0)

## 2024-01-06 LAB — COMPREHENSIVE METABOLIC PANEL
ALT: 16 U/L (ref 0–44)
AST: 17 U/L (ref 15–41)
Albumin: 3.6 g/dL (ref 3.5–5.0)
Alkaline Phosphatase: 82 U/L (ref 38–126)
Anion gap: 8 (ref 5–15)
BUN: 15 mg/dL (ref 6–20)
CO2: 26 mmol/L (ref 22–32)
Calcium: 8.8 mg/dL — ABNORMAL LOW (ref 8.9–10.3)
Chloride: 100 mmol/L (ref 98–111)
Creatinine, Ser: 0.49 mg/dL (ref 0.44–1.00)
GFR, Estimated: >60 mL/min (ref >60)
Glucose, Bld: 261 mg/dL — ABNORMAL HIGH (ref 70–99)
Potassium: 3.5 mmol/L (ref 3.5–5.1)
Sodium: 134 mmol/L — ABNORMAL LOW (ref 135–145)
Total Bilirubin: 0.4 mg/dL (ref 0.0–1.2)
Total Protein: 6.6 g/dL (ref 6.5–8.1)

## 2024-01-06 LAB — CBC WITH DIFFERENTIAL/PLATELET
Abs Immature Granulocytes: 0.02 10*3/uL (ref 0.00–0.07)
Basophils Absolute: 0.1 10*3/uL (ref 0.0–0.1)
Basophils Relative: 1 %
Eosinophils Absolute: 0.2 10*3/uL (ref 0.0–0.5)
Eosinophils Relative: 2 %
HCT: 37.3 % (ref 36.0–46.0)
Hemoglobin: 13.3 g/dL (ref 12.0–15.0)
Immature Granulocytes: 0 %
Lymphocytes Relative: 28 %
Lymphs Abs: 1.8 10*3/uL (ref 0.7–4.0)
MCH: 32 pg (ref 26.0–34.0)
MCHC: 35.7 g/dL (ref 30.0–36.0)
MCV: 89.7 fL (ref 80.0–100.0)
Monocytes Absolute: 0.5 10*3/uL (ref 0.1–1.0)
Monocytes Relative: 8 %
Neutro Abs: 3.9 10*3/uL (ref 1.7–7.7)
Neutrophils Relative %: 61 %
Platelets: 290 10*3/uL (ref 150–400)
RBC: 4.16 MIL/uL (ref 3.87–5.11)
RDW: 11 % — ABNORMAL LOW (ref 11.5–15.5)
WBC: 6.4 10*3/uL (ref 4.0–10.5)
nRBC: 0 % (ref 0.0–0.2)

## 2024-01-06 LAB — URINALYSIS, MICROSCOPIC (REFLEX)
Bacteria, UA: NONE SEEN
RBC / HPF: NONE SEEN RBC/hpf (ref 0–5)

## 2024-01-06 LAB — CBG MONITORING, ED: Glucose-Capillary: 284 mg/dL — ABNORMAL HIGH (ref 70–99)

## 2024-01-06 LAB — RAPID URINE DRUG SCREEN, HOSP PERFORMED
Amphetamines: NOT DETECTED
Barbiturates: NOT DETECTED
Benzodiazepines: NOT DETECTED
Cocaine: NOT DETECTED
Opiates: NOT DETECTED
Tetrahydrocannabinol: POSITIVE — AB

## 2024-01-06 LAB — PREGNANCY, URINE: Preg Test, Ur: NEGATIVE

## 2024-01-06 MED ORDER — SODIUM CHLORIDE 0.9 % IV BOLUS
1000.0000 mL | Freq: Once | INTRAVENOUS | Status: AC
  Administered 2024-01-06: 1000 mL via INTRAVENOUS

## 2024-01-06 NOTE — Discharge Instructions (Signed)
 It was a pleasure caring for you today in the emergency department.  Please follow-up with your endocrinologist and primary care  Please return to the emergency department for any worsening or worrisome symptoms.

## 2024-01-06 NOTE — ED Provider Notes (Signed)
 [place] [facility] Provider Note  CSN: [id number] Arrival date & time: 01/06/24 1745  Chief Complaint(s) Hyperglycemia and Hypertension  HPI Ashley Stout is a 33 y.o. female with past medical history as below, significant for IDDM who presents to the ED with complaint of hyperglycemia  Recent diagnosed with IDDM around 4 years ago, has been having difficulty with her insulin regimen and maintaining her sugars consistently.  Oftentimes will run in the 400s.  She follows endocrinology, she does not need to see them until May.  She had sugar checked and is over 400, she is advised to come to the ER for evaluation from endocrinology office.  She reports she has been feeling her typical self, she is currently symptomatic.  She has chronic polydipsia that is unchanged.  Chronic polyuria is also unchanged.  No abdominal pain nausea vomiting, fever chills.  She is compliant with home insulin regimen.  No recent diet or medication changes Past Medical History Past Medical History:  Diagnosis Date   Diabetes (HCC) 10/2019   There are no active problems to display for this patient.  Home Medication(s) Prior to Admission medications   Medication Sig Start Date End Date Taking? Authorizing Provider  aspirin-acetaminophen-caffeine (EXCEDRIN MIGRAINE) [phone] MG tablet Take 1 tablet by mouth every 6 (six) hours as needed for headache. Patient not taking: Reported on 03/11/2020 07/24/18   Garlon Hatchet, PA-C  cyclobenzaprine (FLEXERIL) 10 MG tablet Take 1 tablet (10 mg total) by mouth 2 (two) times daily as needed for muscle spasms. 08/05/22   Carroll Sage, PA-C  diphenhydrAMINE (BENADRYL) 25 MG tablet Take 1 tablet (25 mg total) by mouth every 8 (eight) hours as needed for itching. Patient not taking: Reported on 07/24/2018 03/11/17   Georgiana Shore, PA-C  Insulin Degludec (TRESIBA) 100 UNIT/ML SOLN Inject into the skin.    [provider]   insulin regular (NOVOLIN R) 100 units/mL injection Inject into the skin 3 (three) times daily before meals.    [provider]  triamcinolone ointment (KENALOG) 0.1 % Apply 1 application topically at bedtime. 03/11/20   Glyn Ade, PA-C                                                                                                                                    Past Surgical History Past Surgical History:  Procedure Laterality Date   INDUCED ABORTION     Family History Family History  Problem Relation Age of Onset   Cancer Mother    Hypertension Father     Social History Social History   Tobacco Use   Smoking status: Every Day    Current packs/day: 0.10    Types: Cigarettes   Smokeless tobacco: Never  Vaping Use   Vaping status: Never Used  Substance Use Topics   Alcohol use: Not Currently    Comment: occ   Drug  use: Not Currently    Types: Marijuana    Comment: pt denies   Allergies Acetaminophen  Review of Systems A thorough review of systems was obtained and all systems are negative except as noted in the HPI and PMH.   Physical Exam Vital Signs  I have reviewed the triage vital signs BP 108/70   Pulse 77   Temp 98.4 F (36.9 C)   Resp 14   Ht 5\' 4"  (1.626 m)   Wt 50.8 kg   LMP 12/27/2023 (Exact Date)   SpO2 98%   BMI 19.22 kg/m  Physical Exam Vitals and nursing note reviewed.  Constitutional:      General: She is not in acute distress.    Appearance: Normal appearance.  HENT:     Head: Normocephalic and atraumatic.     Right Ear: External ear normal.     Left Ear: External ear normal.     Nose: Nose normal.     Mouth/Throat:     Mouth: Mucous membranes are moist.  Eyes:     General: No scleral icterus.       Right eye: No discharge.        Left eye: No discharge.  Cardiovascular:     Rate and Rhythm: Normal rate and regular rhythm.     Pulses: Normal pulses.     Heart sounds: Normal heart sounds.  Pulmonary:     Effort:  Pulmonary effort is normal. No respiratory distress.     Breath sounds: Normal breath sounds. No stridor.  Abdominal:     General: Abdomen is flat. There is no distension.     Palpations: Abdomen is soft.     Tenderness: There is no abdominal tenderness.  Musculoskeletal:     Cervical back: No rigidity.     Right lower leg: No edema.     Left lower leg: No edema.  Skin:    General: Skin is warm and dry.     Capillary Refill: Capillary refill takes less than 2 seconds.  Neurological:     Mental Status: She is alert.  Psychiatric:        Mood and Affect: Mood normal.        Behavior: Behavior normal. Behavior is cooperative.     ED Results and Treatments Labs (all labs ordered are listed, but only abnormal results are displayed) Labs Reviewed  CBC WITH DIFFERENTIAL/PLATELET - Abnormal; Notable for the following components:      Result Value   RDW 11.0 (*)    All other components within normal limits  COMPREHENSIVE METABOLIC PANEL - Abnormal; Notable for the following components:   Sodium 134 (*)    Glucose, Bld 261 (*)    Calcium 8.8 (*)    All other components within normal limits  URINALYSIS, ROUTINE W REFLEX MICROSCOPIC - Abnormal; Notable for the following components:   Color, Urine STRAW (*)    Glucose, UA >=500 (*)    All other components within normal limits  RAPID URINE DRUG SCREEN, HOSP PERFORMED - Abnormal; Notable for the following components:   Tetrahydrocannabinol POSITIVE (*)    All other components within normal limits  CBG MONITORING, ED - Abnormal; Notable for the following components:   Glucose-Capillary 284 (*)    All other components within normal limits  PREGNANCY, URINE  URINALYSIS, MICROSCOPIC (REFLEX)  Radiology No results found.  Pertinent labs & imaging results that were available during my care of the patient were reviewed  by me and considered in my medical decision making (see MDM for details).  Medications Ordered in ED Medications  sodium chloride 0.9 % bolus 1,000 mL (0 mLs Intravenous Stopped 01/06/24 1958)                                                                                                                                     Procedures Procedures  (including critical care time)  Medical Decision Making / ED Course    Medical Decision Making:    Ashley Stout is a 33 y.o. female with past medical history as below, significant for IDDM who presents to the ED with complaint of hyperglycemia. The complaint involves an extensive differential diagnosis and also carries with it a high risk of complications and morbidity.  Serious etiology was considered. Ddx includes but is not limited to: DKA, HHS, hyperglycemia,  Complete initial physical exam performed, notably the patient was in no acute distress.    Reviewed and confirmed nursing documentation for past medical history, family history, social history.  Vital signs reviewed.     Clinical Course as of 01/06/24 2038  Fri Jan 06, 2024  1954 Tetrahydrocannabinol(!): POSITIVE [SG]  1954 Anion gap: 8 [SG]  1954 Ketones, ur: NEGATIVE DKA unlikely  [SG]  2021 Labs stable [SG]  2021 She has poorly controlled diabetes, reports her sugar frequently runs close to 400.  She is not due to see endocrinology for another few months.  Encouraged close follow-up with endocrinology and her PCP.  Diabetic diet.  Monitor sugar closely at home. [SG]  2032 Tetrahydrocannabinol(!): POSITIVE Recommend avoid thc [SG]    Clinical Course User Index [SG] Sloan Leiter, DO    Brief summary: 33 year old female history of IDDM here with hyperglycemia.  Labs reviewed, these are stable.  She is asymptomatic.  She does not appear to have evidence of DKA or HHS.  She is feeling better after IV fluids.  Encourage close follow-up with endocrinology, diabetic diet.   Close monitoring for glucose.  The patient improved significantly and was discharged in stable condition. Detailed discussions were had with the patient/guardian regarding current findings, and need for close f/u with PCP or on call doctor. The patient/guardian has been instructed to return immediately if the symptoms worsen in any way for re-evaluation. Patient/guardian verbalized understanding and is in agreement with current care plan. All questions answered prior to discharge.                  Additional history obtained: -Additional history obtained from family -External records from outside source obtained and reviewed including: Chart review including previous notes, labs, imaging, consultation notes including  Urgent care documentation, endocrinology documentation, home medication   Lab Tests: -I ordered, reviewed, and interpreted labs.   The pertinent results  include:   Labs Reviewed  CBC WITH DIFFERENTIAL/PLATELET - Abnormal; Notable for the following components:      Result Value   RDW 11.0 (*)    All other components within normal limits  COMPREHENSIVE METABOLIC PANEL - Abnormal; Notable for the following components:   Sodium 134 (*)    Glucose, Bld 261 (*)    Calcium 8.8 (*)    All other components within normal limits  URINALYSIS, ROUTINE W REFLEX MICROSCOPIC - Abnormal; Notable for the following components:   Color, Urine STRAW (*)    Glucose, UA >=500 (*)    All other components within normal limits  RAPID URINE DRUG SCREEN, HOSP PERFORMED - Abnormal; Notable for the following components:   Tetrahydrocannabinol POSITIVE (*)    All other components within normal limits  CBG MONITORING, ED - Abnormal; Notable for the following components:   Glucose-Capillary 284 (*)    All other components within normal limits  PREGNANCY, URINE  URINALYSIS, MICROSCOPIC (REFLEX)    Notable for labs stable  EKG   EKG Interpretation Date/Time:    Ventricular Rate:     PR Interval:    QRS Duration:    QT Interval:    QTC Calculation:   R Axis:      Text Interpretation:           Imaging Studies ordered: na   Medicines ordered and prescription drug management: Meds ordered this encounter  Medications   sodium chloride 0.9 % bolus 1,000 mL    -I have reviewed the patients home medicines and have made adjustments as needed   Consultations Obtained: na   Cardiac Monitoring: Continuous pulse oximetry interpreted by myself, 100% on RA.    Social Determinants of Health:  Diagnosis or treatment significantly limited by social determinants of health: current smoker   Reevaluation: After the interventions noted above, I reevaluated the patient and found that they have resolved  Co morbidities that complicate the patient evaluation  Past Medical History:  Diagnosis Date   Diabetes (HCC) 10/2019      Dispostion: Disposition decision including need for hospitalization was considered, and patient discharged from emergency department.    Final Clinical Impression(s) / ED Diagnoses Final diagnoses:  Hyperglycemia due to diabetes mellitus (HCC)        Sloan Leiter, DO 01/06/24 2038

## 2024-01-06 NOTE — ED Triage Notes (Signed)
 Pt states went to urgent care yesterday  States was sent for fluids  Blood Sugar 461 yesterday  292 now with Josephine Igo   Denies any symptoms of hyperglycemia at this time

## 2024-01-09 DIAGNOSIS — E1065 Type 1 diabetes mellitus with hyperglycemia: Secondary | ICD-10-CM | POA: Diagnosis not present

## 2024-02-14 DIAGNOSIS — E1065 Type 1 diabetes mellitus with hyperglycemia: Secondary | ICD-10-CM | POA: Diagnosis not present

## 2024-02-14 DIAGNOSIS — B3731 Acute candidiasis of vulva and vagina: Secondary | ICD-10-CM | POA: Diagnosis not present

## 2024-02-14 DIAGNOSIS — R0602 Shortness of breath: Secondary | ICD-10-CM | POA: Diagnosis not present

## 2024-02-14 DIAGNOSIS — F17210 Nicotine dependence, cigarettes, uncomplicated: Secondary | ICD-10-CM | POA: Diagnosis not present

## 2024-02-21 DIAGNOSIS — Z113 Encounter for screening for infections with a predominantly sexual mode of transmission: Secondary | ICD-10-CM | POA: Diagnosis not present

## 2024-02-21 DIAGNOSIS — E1065 Type 1 diabetes mellitus with hyperglycemia: Secondary | ICD-10-CM | POA: Diagnosis not present

## 2024-02-27 DIAGNOSIS — M9902 Segmental and somatic dysfunction of thoracic region: Secondary | ICD-10-CM | POA: Diagnosis not present

## 2024-02-27 DIAGNOSIS — M9901 Segmental and somatic dysfunction of cervical region: Secondary | ICD-10-CM | POA: Diagnosis not present

## 2024-02-27 DIAGNOSIS — M62830 Muscle spasm of back: Secondary | ICD-10-CM | POA: Diagnosis not present

## 2024-02-27 DIAGNOSIS — M531 Cervicobrachial syndrome: Secondary | ICD-10-CM | POA: Diagnosis not present

## 2024-03-19 DIAGNOSIS — J019 Acute sinusitis, unspecified: Secondary | ICD-10-CM | POA: Diagnosis not present

## 2024-03-19 DIAGNOSIS — Z9109 Other allergy status, other than to drugs and biological substances: Secondary | ICD-10-CM | POA: Diagnosis not present

## 2024-03-19 DIAGNOSIS — B9789 Other viral agents as the cause of diseases classified elsewhere: Secondary | ICD-10-CM | POA: Diagnosis not present

## 2024-03-19 DIAGNOSIS — J208 Acute bronchitis due to other specified organisms: Secondary | ICD-10-CM | POA: Diagnosis not present

## 2024-03-30 ENCOUNTER — Other Ambulatory Visit: Payer: Self-pay

## 2024-03-30 ENCOUNTER — Emergency Department (HOSPITAL_COMMUNITY)
Admission: EM | Admit: 2024-03-30 | Discharge: 2024-03-31 | Disposition: A | Discharge status: Home / Self Care | Attending: Emergency Medicine | Admitting: Emergency Medicine

## 2024-03-30 ENCOUNTER — Emergency Department (HOSPITAL_COMMUNITY)

## 2024-03-30 DIAGNOSIS — X509XXA Other and unspecified overexertion or strenuous movements or postures, initial encounter: Secondary | ICD-10-CM | POA: Insufficient documentation

## 2024-03-30 DIAGNOSIS — S43014A Anterior dislocation of right humerus, initial encounter: Secondary | ICD-10-CM | POA: Insufficient documentation

## 2024-03-30 DIAGNOSIS — S43004A Unspecified dislocation of right shoulder joint, initial encounter: Secondary | ICD-10-CM

## 2024-03-30 DIAGNOSIS — R0902 Hypoxemia: Secondary | ICD-10-CM | POA: Diagnosis not present

## 2024-03-30 DIAGNOSIS — S4991XA Unspecified injury of right shoulder and upper arm, initial encounter: Secondary | ICD-10-CM | POA: Diagnosis not present

## 2024-03-30 DIAGNOSIS — Z794 Long term (current) use of insulin: Secondary | ICD-10-CM | POA: Insufficient documentation

## 2024-03-30 DIAGNOSIS — E1065 Type 1 diabetes mellitus with hyperglycemia: Secondary | ICD-10-CM | POA: Diagnosis not present

## 2024-03-30 MED ORDER — MIDAZOLAM HCL 2 MG/2ML IJ SOLN
1.0000 mg | Freq: Once | INTRAMUSCULAR | Status: AC
  Administered 2024-03-30: 1 mg via INTRAVENOUS
  Filled 2024-03-30: qty 2

## 2024-03-30 MED ORDER — HYDROMORPHONE HCL 1 MG/ML IJ SOLN
1.0000 mg | Freq: Once | INTRAMUSCULAR | Status: AC
  Administered 2024-03-30: 1 mg via INTRAVENOUS
  Filled 2024-03-30: qty 1

## 2024-03-30 MED ORDER — PROPOFOL 10 MG/ML IV BOLUS
0.5000 mg/kg | Freq: Once | INTRAVENOUS | Status: AC
  Administered 2024-03-31: 25.4 mg via INTRAVENOUS
  Filled 2024-03-30: qty 20

## 2024-03-30 NOTE — ED Provider Notes (Signed)
 [place] EMERGENCY DEPARTMENT AT [facility] Provider Note   CSN: [id number] Arrival date & time: 03/30/24  2309     History  No chief complaint on file.   Ashley Stout is a 33 y.o. female.  Type I diabetic presents with right shoulder pain.  States she was moving her arm in a throwing motion and believes her right shoulder dislocated.  Did not fall directly on it.  Has dislocated her shoulder several times in the past most recently 10 years ago.  No previous surgeries.  No direct fall or trauma.  Intact distal strength.  No numbness or tingling.  No chest pain or shortness of breath.  Received fentanyl  with minimal relief.  The history is provided by the patient.       Home Medications Prior to Admission medications   Medication Sig Start Date End Date Taking? Authorizing Provider  aspirin -acetaminophen -caffeine  (EXCEDRIN  MIGRAINE) 250-250-65 MG tablet Take 1 tablet by mouth every 6 (six) hours as needed for headache. Patient not taking: Reported on 03/11/2020 07/24/18   Coretha Dew, PA-C  cyclobenzaprine  (FLEXERIL ) 10 MG tablet Take 1 tablet (10 mg total) by mouth 2 (two) times daily as needed for muscle spasms. 08/05/22   Volney Grumbles, PA-C  diphenhydrAMINE  (BENADRYL ) 25 MG tablet Take 1 tablet (25 mg total) by mouth every 8 (eight) hours as needed for itching. Patient not taking: Reported on 07/24/2018 03/11/17   Mitchell, Jessica B, PA-C  Insulin Degludec (TRESIBA) 100 UNIT/ML SOLN Inject into the skin.    [provider]  insulin regular (NOVOLIN R) 100 units/mL injection Inject into the skin 3 (three) times daily before meals.    [provider]  triamcinolone  ointment (KENALOG ) 0.1 % Apply 1 application topically at bedtime. 03/11/20   Sheffield, Kelli R, PA-C      Allergies    Acetaminophen     Review of Systems   Review of Systems  Constitutional:  Negative for activity change, appetite change and fever.  HENT:  Negative for  congestion and rhinorrhea.   Respiratory:  Negative for cough, chest tightness and shortness of breath.   Cardiovascular:  Negative for chest pain.  Gastrointestinal:  Negative for abdominal pain, nausea and vomiting.  Musculoskeletal:  Positive for arthralgias and myalgias.  Skin:  Negative for rash.  Neurological:  Negative for dizziness, weakness and headaches.   all other systems are negative except as noted in the HPI and PMH.    Physical Exam Updated Vital Signs BP (!) 122/100   Temp 97.7 F (36.5 C) (Oral)   Resp 20   Wt 50.8 kg   SpO2 100%   BMI 19.22 kg/m  Physical Exam Vitals and nursing note reviewed.  Constitutional:      General: She is not in acute distress.    Appearance: She is well-developed.     Comments: uncomfortable  HENT:     Head: Normocephalic and atraumatic.     Mouth/Throat:     Pharynx: No oropharyngeal exudate.  Eyes:     Conjunctiva/sclera: Conjunctivae normal.     Pupils: Pupils are equal, round, and reactive to light.  Neck:     Comments: No meningismus. Cardiovascular:     Rate and Rhythm: Normal rate and regular rhythm.     Heart sounds: Normal heart sounds. No murmur heard. Pulmonary:     Effort: Pulmonary effort is normal. No respiratory distress.     Breath sounds: Normal breath sounds.  Abdominal:  Palpations: Abdomen is soft.     Tenderness: There is no abdominal tenderness. There is no guarding or rebound.  Musculoskeletal:        General: Swelling, tenderness and deformity present.     Cervical back: Normal range of motion and neck supple.     Comments: Right shoulder held in abducted position of comfort.  There is palpable squared off deformity of glenohumeral joint.  Intact radial pulse and cardinal hand movements.  Skin:    General: Skin is warm.  Neurological:     Mental Status: She is alert and oriented to person, place, and time.     Cranial Nerves: No cranial nerve deficit.     Motor: No abnormal muscle tone.      Coordination: Coordination normal.     Comments:  5/5 strength throughout. CN 2-12 intact.Equal grip strength.   Psychiatric:        Behavior: Behavior normal.     ED Results / Procedures / Treatments   Labs (all labs ordered are listed, but only abnormal results are displayed) Labs Reviewed  CBC WITH DIFFERENTIAL/PLATELET - Abnormal; Notable for the following components:      Result Value   RDW 11.1 (*)    All other components within normal limits  BASIC METABOLIC PANEL WITH GFR - Abnormal; Notable for the following components:   Sodium 133 (*)    Glucose, Bld 334 (*)    All other components within normal limits  HCG, SERUM, QUALITATIVE    EKG None  Radiology DG Shoulder Right Result Date: 03/31/2024 CLINICAL DATA:  Post reduction. EXAM: RIGHT SHOULDER - 2+ VIEW COMPARISON:  Mar 30, 2024 FINDINGS: There is no evidence of fracture or dislocation. There has been interval reduction of the anterior right shoulder dislocation seen on the prior study. There is no evidence of arthropathy or other focal bone abnormality. Soft tissues are unremarkable. IMPRESSION: Interval reduction of the anterior right shoulder dislocation seen on the prior study. Electronically Signed   By: Virgle Grime M.D.   On: 03/31/2024 01:36   DG Shoulder Right Portable Result Date: 03/30/2024 CLINICAL DATA:  Dislocation. EXAM: RIGHT SHOULDER - 1 VIEW COMPARISON:  August 05, 2022 FINDINGS: There is anterior dislocation of the right humeral head with respect to the right glenoid. Associated fracture deformity is not identified. There is no evidence of arthropathy or other focal bone abnormality. Soft tissues are unremarkable. IMPRESSION: Anterior right shoulder dislocation. Electronically Signed   By: Virgle Grime M.D.   On: 03/30/2024 23:49    Procedures .Sedation  Date/Time: 03/31/2024 1:00 AM  Performed by: Earma Gloss, MD Authorized by: Earma Gloss, MD   Consent:    Consent obtained:   Verbal   Consent given by:  Patient   Risks discussed:  Allergic reaction, dysrhythmia, inadequate sedation, nausea, prolonged hypoxia resulting in organ damage, prolonged sedation necessitating reversal, respiratory compromise necessitating ventilatory assistance and intubation and vomiting   Alternatives discussed:  Analgesia without sedation, anxiolysis and regional anesthesia Universal protocol:    Procedure explained and questions answered to patient or proxy's satisfaction: yes     Relevant documents present and verified: yes     Test results available: yes     Imaging studies available: yes     Required blood products, implants, devices, and special equipment available: yes     Site/side marked: yes     Immediately prior to procedure, a time out was called: yes     Patient identity confirmed:  Verbally  with patient Indications:    Procedure performed:  Dislocation reduction   Procedure necessitating sedation performed by:  Physician performing sedation Pre-sedation assessment:    Time since last food or drink:  6   ASA classification: class 1 - normal, healthy patient     Mouth opening:  3 or more finger widths   Thyromental distance:  4 finger widths   Mallampati score:  I - soft palate, uvula, fauces, pillars visible   Neck mobility: normal     Pre-sedation assessments completed and reviewed: airway patency, cardiovascular function, hydration status, mental status, nausea/vomiting, pain level, respiratory function and temperature   A pre-sedation assessment was completed prior to the start of the procedure Immediate pre-procedure details:    Reassessment: Patient reassessed immediately prior to procedure     Reviewed: vital signs, relevant labs/tests and NPO status     Verified: bag valve mask available, emergency equipment available, intubation equipment available, IV patency confirmed, oxygen available and suction available   Procedure details (see MAR for exact dosages):     Preoxygenation:  Nasal cannula   Sedation:  Midazolam   Intended level of sedation: moderate (conscious sedation)   Intra-procedure monitoring:  Blood pressure monitoring, cardiac monitor, continuous pulse oximetry, frequent LOC assessments, frequent vital sign checks and continuous capnometry   Intra-procedure events: respiratory depression     Intra-procedure management:  Airway repositioning, BVM ventilation and supplemental oxygen   Total Provider sedation time (minutes):  15 Post-procedure details:   A post-sedation assessment was completed following the completion of the procedure.   Attendance: Constant attendance by certified staff until patient recovered     Recovery: Patient returned to pre-procedure baseline     Post-sedation assessments completed and reviewed: airway patency, cardiovascular function, hydration status, mental status, nausea/vomiting, pain level, respiratory function and temperature     Patient is stable for discharge or admission: yes     Procedure completion:  Tolerated well, no immediate complications .Reduction of dislocation  Date/Time: 03/31/2024 1:01 AM  Performed by: Earma Gloss, MD Authorized by: Earma Gloss, MD  Consent: Written consent obtained. Risks and benefits: risks, benefits and alternatives were discussed Consent given by: patient Patient understanding: patient states understanding of the procedure being performed Patient consent: the patient's understanding of the procedure matches consent given Procedure consent: procedure consent matches procedure scheduled Relevant documents: relevant documents present and verified Test results: test results available and properly labeled Site marked: the operative site was marked Imaging studies: imaging studies available Required items: required blood products, implants, devices, and special equipment available Patient identity confirmed: verbally with patient Time out: Immediately prior to  procedure a "time out" was called to verify the correct patient, procedure, equipment, support staff and site/side marked as required. Preparation: Patient was prepped and draped in the usual sterile fashion. Local anesthesia used: no  Anesthesia: Local anesthesia used: no  Sedation: Patient sedated: yes Sedation type: moderate (conscious) sedation Sedatives: propofol  Sedation start date/time: 03/31/2024 12:29 AM Sedation end date/time: 03/31/2024 12:45 AM Vitals: Vital signs were monitored during sedation.  Patient tolerance: patient tolerated the procedure well with no immediate complications       Medications Ordered in ED Medications  propofol  (DIPRIVAN ) 10 mg/mL bolus/IV push 25.4 mg (has no administration in time range)  HYDROmorphone (DILAUDID) injection 1 mg (has no administration in time range)  midazolam (VERSED) injection 1 mg (has no administration in time range)    ED Course/ Medical Decision Making/ A&P  Medical Decision Making Amount and/or Complexity of Data Reviewed Independent Historian: EMS Labs: ordered. Decision-making details documented in ED Course. Radiology: ordered and independent interpretation performed. Decision-making details documented in ED Course. ECG/medicine tests: ordered and independent interpretation performed. Decision-making details documented in ED Course.  Risk Prescription drug management.   Suspected right shoulder dislocation.  Stable vitals.  No distress.  Neurovascularly intact.  Vitals are stable.  Denies any other injury.  Shoulder dislocation as above.  This was reduced with conscious sedation at bedside.  Patient did have episode of apnea requiring BVM temporarily.  Otherwise she tolerated sedation well.  Shoulder reduced on repeat x-ray.  No fracture.  Results reviewed and interpreted by me.  Labs show hyperglycemia without evidence of DKA.  Insulin dose given.  Will refer to  orthopedics for further evaluation of her recurrent shoulder dislocations.  Return precautions discussed.        Final Clinical Impression(s) / ED Diagnoses Final diagnoses:  Dislocation of right shoulder joint, initial encounter    Rx / DC Orders ED Discharge Orders     None         Shaivi Rothschild, Mara [place], MD 03/31/24 [phone]
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
## 2024-03-31 ENCOUNTER — Emergency Department (HOSPITAL_COMMUNITY)

## 2024-03-31 DIAGNOSIS — S43014A Anterior dislocation of right humerus, initial encounter: Secondary | ICD-10-CM | POA: Diagnosis not present

## 2024-03-31 LAB — BASIC METABOLIC PANEL WITH GFR
Anion gap: 12 (ref 5–15)
BUN: 16 mg/dL (ref 6–20)
CO2: 22 mmol/L (ref 22–32)
Calcium: 9.1 mg/dL (ref 8.9–10.3)
Chloride: 99 mmol/L (ref 98–111)
Creatinine, Ser: 0.9 mg/dL (ref 0.44–1.00)
GFR, Estimated: >60 mL/min (ref >60)
Glucose, Bld: 334 mg/dL — ABNORMAL HIGH (ref 70–99)
Potassium: 4.3 mmol/L (ref 3.5–5.1)
Sodium: 133 mmol/L — ABNORMAL LOW (ref 135–145)

## 2024-03-31 LAB — CBC WITH DIFFERENTIAL/PLATELET
Abs Immature Granulocytes: 0.04 10*3/uL (ref 0.00–0.07)
Basophils Absolute: 0.1 10*3/uL (ref 0.0–0.1)
Basophils Relative: 1 %
Eosinophils Absolute: 0.1 10*3/uL (ref 0.0–0.5)
Eosinophils Relative: 2 %
HCT: 41.3 % (ref 36.0–46.0)
Hemoglobin: 14.3 g/dL (ref 12.0–15.0)
Immature Granulocytes: 1 %
Lymphocytes Relative: 33 %
Lymphs Abs: 2.1 10*3/uL (ref 0.7–4.0)
MCH: 31.6 pg (ref 26.0–34.0)
MCHC: 34.6 g/dL (ref 30.0–36.0)
MCV: 91.2 fL (ref 80.0–100.0)
Monocytes Absolute: 0.5 10*3/uL (ref 0.1–1.0)
Monocytes Relative: 8 %
Neutro Abs: 3.5 10*3/uL (ref 1.7–7.7)
Neutrophils Relative %: 55 %
Platelets: 289 10*3/uL (ref 150–400)
RBC: 4.53 MIL/uL (ref 3.87–5.11)
RDW: 11.1 % — ABNORMAL LOW (ref 11.5–15.5)
WBC: 6.4 10*3/uL (ref 4.0–10.5)
nRBC: 0 % (ref 0.0–0.2)

## 2024-03-31 MED ORDER — INSULIN ASPART 100 UNIT/ML IJ SOLN
5.0000 [IU] | Freq: Once | INTRAMUSCULAR | Status: DC
Start: 2024-03-31 — End: 2024-03-31
  Filled 2024-03-31: qty 0.05

## 2024-03-31 NOTE — ED Notes (Signed)
 RT AT BEDSIDE FOR CONSCIENCE SEDATION 2349 ON 2L [place] WITH A SAT OF 100% HEART RATE OF 79, BLOOD PRESSURE 124/94 ETCO2 27 M  DURING PROCEDURE PATIENT REQUIRED MANUAL VENTILATION FOR ABOUT 6 MINUTES, DUE TO APNEA.   VITAL SIGNS REMAINED WELL DURING THE PROCEDURE  AT [phone] PATIENT AWAKE , AND COMMUNICATING WITH THE DOCTOR.  PLACED PATIENT ON ROOM AIR BEFORE EXITING THE ROOM. AT TIME OF EXIT SHE HAD A SAT OF  100%  , HEART RATE OF 86, BLOOD PRESSURE OF 121/84, ETSCO2 OF 34.   EMERGENCY AIRWAY EQUIPMENT WAS AVAILABLE, AND SUCTION AT THE HEAD OF BED BEFORE THE PROCEDURE WAS STARTED.

## 2024-03-31 NOTE — ED Notes (Signed)
 03/31/24 0850 opened chart to provide a work not requested by pt.

## 2024-03-31 NOTE — Discharge Instructions (Addendum)
 Wear sling and follow-up with the orthopedic doctor for further evaluation of your recurrent shoulder dislocations.  Your blood sugar today was high at 334.  You should take your insulin as prescribed.  Return to the ED with new or worsening symptoms.

## 2024-03-31 NOTE — Sedation Documentation (Signed)
 Pt shoulder in place

## 2024-04-04 ENCOUNTER — Ambulatory Visit (INDEPENDENT_AMBULATORY_CARE_PROVIDER_SITE_OTHER): Admitting: Orthopaedic Surgery

## 2024-04-04 DIAGNOSIS — S43014D Anterior dislocation of right humerus, subsequent encounter: Secondary | ICD-10-CM | POA: Diagnosis not present

## 2024-04-04 NOTE — Progress Notes (Signed)
 The patient is a 33 year old female that I am seeing for the first time as a referral from the emergency room after having an anterior shoulder dislocation on the 23rd of this month.  She was doing minimal activities when this happened.  She has had a history of at least 5 dislocations of that shoulder and has had to have the shoulder put back in.  She has never had surgery on the shoulder either.  She works for the [employer].  On exam clinically the shoulder is in place.  She does have a positive apprehension sign.  Her neurovascular exam on the upper extremity is normal.  Previous x-rays of her right shoulder shows anterior dislocations and then the shoulder reduced.  I went over her shoulder model with her and the description of why her shoulder keeps coming out of place.  It is essential at this point a MRI arthrogram is obtained of the right shoulder to assess the labrum in order to then get her in with a shoulder specialist for likely surgical intervention to tighten up the shoulder given her frequency of dislocations.  She agrees with this treatment plan as well.  I did give her a note to keep her on light duty work with no reaching overhead and no lifting greater than 10 to 15 pounds.  Having had dislocations in the past she is aware of positions to try to avoid to keep her shoulders in place.  All question concerns were answered and addressed.

## 2024-04-05 ENCOUNTER — Other Ambulatory Visit: Payer: Self-pay

## 2024-04-05 DIAGNOSIS — S43014D Anterior dislocation of right humerus, subsequent encounter: Secondary | ICD-10-CM

## 2024-04-10 DIAGNOSIS — N644 Mastodynia: Secondary | ICD-10-CM | POA: Diagnosis not present

## 2024-04-10 DIAGNOSIS — Z3042 Encounter for surveillance of injectable contraceptive: Secondary | ICD-10-CM | POA: Diagnosis not present

## 2024-04-10 DIAGNOSIS — N760 Acute vaginitis: Secondary | ICD-10-CM | POA: Diagnosis not present

## 2024-04-18 ENCOUNTER — Ambulatory Visit
Admission: RE | Admit: 2024-04-18 | Discharge: 2024-04-18 | Disposition: A | Discharge status: Home / Self Care | Source: Ambulatory Visit | Attending: Orthopaedic Surgery | Admitting: Orthopaedic Surgery

## 2024-04-18 ENCOUNTER — Encounter: Payer: Self-pay | Admitting: General Practice

## 2024-04-18 DIAGNOSIS — S43014D Anterior dislocation of right humerus, subsequent encounter: Secondary | ICD-10-CM

## 2024-04-18 DIAGNOSIS — M25511 Pain in right shoulder: Secondary | ICD-10-CM | POA: Diagnosis not present

## 2024-04-18 MED ORDER — IOPAMIDOL (ISOVUE-M 200) INJECTION 41%
11.0000 mL | Freq: Once | INTRAMUSCULAR | Status: AC
  Administered 2024-04-18: 11 mL via INTRA_ARTICULAR

## 2024-04-26 ENCOUNTER — Ambulatory Visit (HOSPITAL_BASED_OUTPATIENT_CLINIC_OR_DEPARTMENT_OTHER): Payer: Self-pay | Admitting: Orthopaedic Surgery

## 2024-04-26 ENCOUNTER — Other Ambulatory Visit (HOSPITAL_BASED_OUTPATIENT_CLINIC_OR_DEPARTMENT_OTHER): Payer: Self-pay

## 2024-04-26 ENCOUNTER — Ambulatory Visit (HOSPITAL_BASED_OUTPATIENT_CLINIC_OR_DEPARTMENT_OTHER): Admitting: Orthopaedic Surgery

## 2024-04-26 DIAGNOSIS — S43014D Anterior dislocation of right humerus, subsequent encounter: Secondary | ICD-10-CM

## 2024-04-26 MED ORDER — IBUPROFEN 800 MG PO TABS
800.0000 mg | ORAL_TABLET | Freq: Three times a day (TID) | ORAL | 0 refills | Status: AC
  Filled 2024-04-26: qty 30, 10d supply, fill #0

## 2024-04-26 MED ORDER — FLUCONAZOLE 150 MG PO TABS: 150.0000 mg | ORAL_TABLET | ORAL | 6 refills | Status: AC

## 2024-04-26 MED ORDER — INSULIN ASPART 100 UNIT/ML FLEXPEN: 6.0000 [IU] | PEN_INJECTOR | Freq: Every day | SUBCUTANEOUS | 9 refills | Status: AC

## 2024-04-26 MED ORDER — ASPIRIN 325 MG PO TBEC
325.0000 mg | DELAYED_RELEASE_TABLET | Freq: Every day | ORAL | 0 refills | Status: AC
  Filled 2024-04-26: qty 14, 14d supply, fill #0

## 2024-04-26 MED ORDER — ACETAMINOPHEN 500 MG PO TABS
500.0000 mg | ORAL_TABLET | Freq: Three times a day (TID) | ORAL | 0 refills | Status: AC
  Filled 2024-04-26: qty 30, 10d supply, fill #0

## 2024-04-26 MED ORDER — OXYCODONE HCL 5 MG PO TABS
5.0000 mg | ORAL_TABLET | ORAL | 0 refills | Status: AC | PRN
  Filled 2024-04-26: qty 10, 3d supply, fill #0

## 2024-04-26 MED ORDER — INSULIN PEN NEEDLE 32G X 4 MM MISC: 0 refills | Status: AC

## 2024-04-26 MED ORDER — INSULIN DEGLUDEC 100 UNIT/ML ~~LOC~~ SOPN: 10.0000 [IU] | PEN_INJECTOR | Freq: Every day | SUBCUTANEOUS | 2 refills | Status: AC

## 2024-04-26 MED ORDER — FREESTYLE LIBRE 3 SENSOR MISC: 9 refills | Status: AC

## 2024-04-26 MED ORDER — SPIRONOLACTONE 50 MG PO TABS: 50.0000 mg | ORAL_TABLET | Freq: Every day | ORAL | 3 refills | Status: AC

## 2024-04-26 MED ORDER — MEDROXYPROGESTERONE ACETATE 150 MG/ML IM SUSY: 150.0000 mg | PREFILLED_SYRINGE | INTRAMUSCULAR | 3 refills | Status: AC

## 2024-04-26 NOTE — Progress Notes (Signed)
 Chief Complaint: Right shoulder instability     History of Present Illness:    Ashley Stout is a 33 y.o. female right dominant female presents with multiple episodes of shoulder instability that began approximately 10 years ago.  Her initial injury was during tennis when she felt a dislocation of the shoulder and subsequently had to push this back in.  This did not require formal reduction of time.  Since that time she has had nearly 100 instability episodes.  Recently she did dislocate the shoulder when she was simply rushing against a basketball.  She is a type I diabetic.  She states that her right hand is now actively inhibiting her life in terms of having significant instability as she goes overhead.  She does remain very active and does have a child.  She would like to get back to tennis but is unable to do so as result of the shoulder.    PMH/PSH/Family History/Social History/Meds/Allergies:    Past Medical History:  Diagnosis Date   Diabetes (HCC) 10/2019   Past Surgical History:  Procedure Laterality Date   INDUCED ABORTION     Social History   Socioeconomic History   Marital status: Single    Spouse name: Not on file   Number of children: Not on file   Years of education: Not on file   Highest education level: Not on file  Occupational History   Not on file  Tobacco Use   Smoking status: Every Day    Current packs/day: 0.10    Types: Cigarettes   Smokeless tobacco: Never  Vaping Use   Vaping status: Never Used  Substance and Sexual Activity   Alcohol use: Not Currently    Comment: occ   Drug use: Not Currently    Types: Marijuana    Comment: pt denies   Sexual activity: Yes    Birth control/protection: None  Other Topics Concern   Not on file  Social History Narrative   Not on file   Social Drivers of Health   Financial Resource Strain: Not on file  Food Insecurity: Not on file  Transportation Needs: Not on file  Physical Activity: Not on file   Stress: Not on file  Social Connections: Unknown (03/21/2022)   [facility]   Social Network    Social Network: Not on file   Family History  Problem Relation Age of Onset   Cancer Mother    Hypertension Father    Allergies  Allergen Reactions   Acetaminophen  Nausea And Vomiting   Current Outpatient Medications  Medication Sig Dispense Refill   acetaminophen  (TYLENOL ) 500 MG tablet Take 1 tablet (500 mg total) by mouth every 8 (eight) hours for 10 days. 30 tablet 0   aspirin  EC 325 MG tablet Take 1 tablet (325 mg total) by mouth daily. 14 tablet 0   ibuprofen  (ADVIL ) 800 MG tablet Take 1 tablet (800 mg total) by mouth every 8 (eight) hours for 10 days. Please take with food, please alternate with acetaminophen  30 tablet 0   oxyCODONE (ROXICODONE) 5 MG immediate release tablet Take 1 tablet (5 mg total) by mouth every 4 (four) hours as needed for severe pain (pain score 7-10) or breakthrough pain. 10 tablet 0   aspirin -acetaminophen -caffeine  (EXCEDRIN  MIGRAINE) 250-250-65 MG tablet Take 1 tablet by mouth every 6 (six) hours as needed for headache. (Patient not taking: Reported on 03/11/2020) 30 tablet 0   cyclobenzaprine  (FLEXERIL ) 10 MG tablet Take 1 tablet (  10 mg total) by mouth 2 (two) times daily as needed for muscle spasms. 20 tablet 0   diphenhydrAMINE  (BENADRYL ) 25 MG tablet Take 1 tablet (25 mg total) by mouth every 8 (eight) hours as needed for itching. (Patient not taking: Reported on 07/24/2018) 20 tablet 0   Insulin  Degludec (TRESIBA) 100 UNIT/ML SOLN Inject into the skin.     insulin  regular (NOVOLIN R) 100 units/mL injection Inject into the skin 3 (three) times daily before meals.     triamcinolone  ointment (KENALOG ) 0.1 % Apply 1 application topically at bedtime. 454 g 2   No current facility-administered medications for this visit.   No results found.  Review of Systems:   A ROS was performed including pertinent positives and negatives as documented in  the HPI.  Physical Exam :   Constitutional: NAD and appears stated age Neurological: Alert and oriented Psych: Appropriate affect and cooperative Last menstrual period 04/16/2024.   Comprehensive Musculoskeletal Exam:    Right shoulder with positive apprehension overhead.  Active forward elevation is 170 degrees bilaterally.  External rotation at the side is to 60 degrees bilaterally.  There is 2+ anterior load shift on the right.  Distal neurosensory exam is intact   Imaging:   Xray (3 views right shoulder): Normal  MRI (shoulder): Anterior-inferior labral tear with approximately 15% glenoid bone loss and a large Hill-Sachs lesion   I personally reviewed and interpreted the radiographs.   Assessment and Plan:   33 y.o. female with evidence of multiple right shoulder anterior recurrent instability which is now occurring nearly 100 times in terms of a dislocation or subluxation.  She has unfortunately also suffered some anterior glenoid bone loss with a Hill-Sachs lesion.  Given this high number of recurrences and the fact that she has trialed strengthening in the past without any relief we did discuss about arthroscopic intervention.  I did discuss the possibility of arthroscopic labral repair with remplissage.  I did discuss risks and limitations as well as associated recovery time.  After discussion she would like to proceed  -Plan for right shoulder arthroscopy with labral repair and remplissage   After a lengthy discussion of treatment options, including risks, benefits, alternatives, complications of surgical and nonsurgical conservative options, the patient elected surgical repair.   The patient  is aware of the material risks  and complications including, but not limited to injury to adjacent structures, neurovascular injury, infection, numbness, bleeding, implant failure, thermal burns, stiffness, persistent pain, failure to heal, disease transmission from allograft, need for  further surgery, dislocation, anesthetic risks, blood clots, risks of death,and others. The probabilities of surgical success and failure discussed with patient given their particular co-morbidities.The time and nature of expected rehabilitation and recovery was discussed.The patient's questions were all answered preoperatively.  No barriers to understanding were noted. I explained the natural history of the disease process and Rx rationale.  I explained to the patient what I considered to be reasonable expectations given their personal situation.  The final treatment plan was arrived at through a shared patient decision making process model.    I personally saw and evaluated the patient, and participated in the management and treatment plan.  Wilhelmenia Harada, MD Attending Physician, Orthopedic Surgery  This document was dictated using Dragon voice recognition software. A reasonable attempt at proof reading has been made to minimize errors.

## 2024-05-03 ENCOUNTER — Telehealth: Payer: Self-pay | Admitting: Orthopaedic Surgery

## 2024-05-03 ENCOUNTER — Ambulatory Visit: Admitting: Physician Assistant

## 2024-05-03 DIAGNOSIS — S43014D Anterior dislocation of right humerus, subsequent encounter: Secondary | ICD-10-CM

## 2024-05-03 NOTE — Telephone Encounter (Signed)
 I spoke with the patient and scheduled her for surgery on 05/17/24. The patient states doctor advised her she would be out of work for approximately 6 weeks. She needs a note stating that for work. Patient requests you put the note in My Chart for her when ready.

## 2024-05-03 NOTE — Progress Notes (Signed)
 SABRA

## 2024-05-04 ENCOUNTER — Encounter (HOSPITAL_BASED_OUTPATIENT_CLINIC_OR_DEPARTMENT_OTHER): Payer: Self-pay | Admitting: Orthopaedic Surgery

## 2024-05-15 ENCOUNTER — Telehealth (HOSPITAL_BASED_OUTPATIENT_CLINIC_OR_DEPARTMENT_OTHER): Payer: Self-pay | Admitting: Orthopaedic Surgery

## 2024-05-15 ENCOUNTER — Encounter (HOSPITAL_BASED_OUTPATIENT_CLINIC_OR_DEPARTMENT_OTHER): Payer: Self-pay | Admitting: Orthopaedic Surgery

## 2024-05-15 NOTE — Telephone Encounter (Signed)
 Responded via mychart to previous telephone encounter

## 2024-05-15 NOTE — Telephone Encounter (Signed)
Repeat message

## 2024-05-15 NOTE — Telephone Encounter (Signed)
 Patient dates that she needs included in her later that can be uploaded to he Mychart June 11-16 and July 6-8. Best contact number for patient [phone]

## 2024-05-15 NOTE — Telephone Encounter (Signed)
 Patient FMLA was denied and needs a work note for all the appointments from Dr B and the dates for the surgery. Best contact number for the patient [phone]

## 2024-05-17 ENCOUNTER — Encounter (HOSPITAL_BASED_OUTPATIENT_CLINIC_OR_DEPARTMENT_OTHER): Payer: Self-pay | Admitting: Orthopaedic Surgery

## 2024-05-17 DIAGNOSIS — G8918 Other acute postprocedural pain: Secondary | ICD-10-CM | POA: Diagnosis not present

## 2024-05-17 DIAGNOSIS — S43491A Other sprain of right shoulder joint, initial encounter: Secondary | ICD-10-CM | POA: Diagnosis not present

## 2024-05-17 DIAGNOSIS — S43014A Anterior dislocation of right humerus, initial encounter: Secondary | ICD-10-CM | POA: Diagnosis not present

## 2024-05-17 DIAGNOSIS — E109 Type 1 diabetes mellitus without complications: Secondary | ICD-10-CM | POA: Diagnosis not present

## 2024-05-17 NOTE — Progress Notes (Signed)
 Date of Surgery: 05/17/2024  INDICATIONS: Ashley Stout is a 33 y.o.-year-old female with right shoulder labral tear.  The risk and benefits of the procedure were discussed in detail and documented in the pre-operative evaluation.   PREOPERATIVE DIAGNOSIS: 1.  Right shoulder recurrent instability  POSTOPERATIVE DIAGNOSIS: Same.  PROCEDURE: 1.  Right shoulder arthroscopic inferior labral repair 2.  Right shoulder rotator cuff repair/remplissage  SURGEON: Elspeth LITTIE Parker MD  ASSISTANT: Conley Dawson, ATC  ANESTHESIA:  general  IV FLUIDS AND URINE: See anesthesia record.  ANTIBIOTICS: Ancef  ESTIMATED BLOOD LOSS: 10 mL.  DRAINS: None  CULTURES: None  COMPLICATIONS: none  DESCRIPTION OF PROCEDURE:   Examination under anesthesia revealed forward elevation of 165 degrees.  In abduction, there was 90 degrees of external rotation and 70 degrees of internal rotation.  With the arm at the side, there was 70 degrees of external rotation.  There is a 1+ anterior load shift and a 1+ posterior load shift with palpable click as the humerus moved over the glenoid.  Arthroscopic findings demonstrated:  Glenoid cartilage: Normal Humeral head: Normal Labrum:  labral tear from 3 o'clock to 9 o'clock Biceps insertion: Intact Biceps tendon: Intact Subscapularis insertion: Normal Rotator cuff: Large Hill-Sachs lesion  The patient was identified in the preoperative holding area.  The correct site was marked according to universal protocol.  Anesthesia performed an interscalene nerve block.  Ancef was given 1 hour prior to skin incision.  The patient was subsequently taken back to the operating room.  The patient was prepped and draped and positioned in the lateral position.  All bony prominences were padded.  Final timeout was performed.  Standard posterior, anterior and anterosuperolateral portals were utilized. The posterior portal was created with an 11-blade and the arthroscope introduced into  the glenohumeral joint.  A full diagnostic arthroscopy was performed as described above.  A low anterior portal just above the rolled border of the subscapularis was identified with a spinal needle, and then instrumenting cannula was placed.  An anterosuperolateral viewing portal was localized with a spinal needle just posterior to the biceps tendon, and the arthroscope was transferred to this portal.  A posterior portal was also placed for instrumentation.  First, I directed my attention to preparation of the glenoid, labrum and capsule for repair.  The elevator was used to elevate off the injured labrum from the glenoid rim.  A shaver was subsequently introduced and used on forward in order to create bleeding bony bed for the labrum to heal back to.  Debridement was performed of the posterior and inferior labrum with combination of electrocautery and shaver.  Next, I sequentially repaired the capsule and labrum from the 3:00 position to the 9:00 position with a total of 4 anchors.  These were all suture knotless anchors as noted above.  Anchors were placed at the 3:30, 4:30, 7:30, 6:00 positions.  At each location, a pilot hole was drilled, the anchor inserted and deployed.  Then, one limb of suture was shuttled around the labrum and capsule using a suture lasso, taking care to provide both medial to lateral and inferior to superior shift of the tissues.  This was subsequently fed into the knotless mechanism and tensioned.  This was done sequentially for all additional anchors.  At this time the remplissage repair was performed.  2 knotless anchors were placed into the Hill-Sachs lesion.  At this time the posterior portal was removed.  A BirdBeak was used to bring this over the  tissue of the infraspinatus and looped into the other knotless mechanism.  These were shuttled into each other and tensioned with excellent restoration of the infraspinatus into the footprint.  Once completed, the labrum was  restored to an anatomic position, and tension was restored to both bands of the IGHL.  The inferior capsular volume was normalized and the humeral head was centered on the glenoid.     All instruments were removed, fluid was evacuated, and the arthroscopy portals were closed with 3-0 nylon.  A sterile dressing was applied with Xeroform, gauze, ABD and Medipore tape followed by a Iceman device and a sling with an abduction pillow.    The patient awoke from anesthesia without difficulty and was transferred to PACU in stable condition.      POSTOPERATIVE PLAN: She will follow the labral repair protocol.  She was placed on aspirin  for blood clot prevention for 2 weeks.  I will see her back in 2 weeks for suture removal.  She will begin physical therapy immediately  Elspeth LITTIE Parker, MD 4:30 PM

## 2024-05-18 ENCOUNTER — Telehealth (HOSPITAL_BASED_OUTPATIENT_CLINIC_OR_DEPARTMENT_OTHER): Payer: Self-pay | Admitting: Orthopaedic Surgery

## 2024-05-18 NOTE — Telephone Encounter (Signed)
 Patient states that her bicep is hurting please advise [phone]

## 2024-05-18 NOTE — Telephone Encounter (Signed)
 LMOM to return call.

## 2024-05-18 NOTE — Telephone Encounter (Signed)
 Returned call to patient explaining biceps pain is likely due to positioning and to continue taking ibuprofen  and tylenol  as the nerve block wears off and oxycodone  for breakthrough pain

## 2024-05-19 NOTE — Therapy (Signed)
 OUTPATIENT PHYSICAL THERAPY UPPER EXTREMITY EVALUATION   Patient Name: Ashley Stout MRN: [id number] DOB:[birth year], 33 y.o., female Today's Date: 05/22/2024  END OF SESSION:  PT End of Session - 05/21/24 1244     Visit Number 1    Date for PT Re-Evaluation 08/13/24    PT Start Time 1200    PT Stop Time 1240    PT Time Calculation (min) 40 min    Activity Tolerance Patient tolerated treatment well;No increased pain    Behavior During Therapy [facility] for tasks assessed/performed          Past Medical History:  Diagnosis Date   Diabetes (HCC) 10/2019   Past Surgical History:  Procedure Laterality Date   INDUCED ABORTION     There are no active problems to display for this patient.   PCP: Patient, No Pcp Per   REFERRING PROVIDER: Genelle Standing, MD    REFERRING DIAG: S43.014D (ICD-10-CM) - Anterior dislocation of right shoulder, subsequent encounter  THERAPY DIAG:  Acute pain of right shoulder  Muscle weakness (generalized)  Stiffness of right shoulder, not elsewhere classified  RATIONALE FOR EVALUATION AND TREATMENT: Rehabilitation  ONSET DATE: Surgery 05/17/24  NEXT MD VISIT: 06/04/24   SUBJECTIVE:                                                                                                                                                                                                         SUBJECTIVE STATEMENT: EVAL 05/22/24:  Patient is referred to PT from Dr Genelle following a R shoulder inferior labral repair from  3:00 to 9:00 with remplissage of the infraspinatus and posterior capsule to Hill Sach's defect.    Patient has a h/o multiple dislocations of the R shoulder since she was a child  with resultant pain and instability.   She reports she is double-jointed and demonstrates subluxations of multiple joints to me on today's evaluation.    She has orders for bandage change today and her post op dressing is removed.  There are 2 sutures in each of the 3  scope sites that are intact.   There is no wound drainage/redness/dehischence noted.    Patient is advised to go the pharmacy when she leaves the clinic today and obtain a box of tegaderm with pad dressings and apply them to each of the scope sites.  She is advised not to get the surgery sites wet and no bathing until they are covered with tegaderm.   There is some R shoulder edema, but not severe.  Patient reports has been  icing the shoulder regularly.  States sleep is painful and has been trying to sleep on her back in the bed.  She verbalizes understanding of not rolling onto the R shoulder and to use a pillow prop under her arm to keep it from extending beyond neutral.    PAIN: Are you having pain? Yes: NPRS scale: 5/10 now; 8/10 worst Pain location: R shoulder Pain description: sore, aching, radiating into mid to distal humerus, sharp at times Aggravating factors: any unsupported positions of the R shoulder Relieving factors: ice, meds  PERTINENT HISTORY:  Diabetes type 1  PRECAUTIONS: Other: labral repair/remplissage protocol  HAND DOMINANCE: Right  RED FLAGS: None  WEIGHT BEARING RESTRICTIONS: Yes NWB RUE during protection phases of recovery  FALLS:  Has patient fallen in last 6 months? 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   FINDINGS: The supraspinatus, infraspinatus, subscapularis, and teres minor tendons are unremarkable. No bursal fluid. No loose bodies. The biceps tendon is intact. There is a complex tear to the anterior/inferior labrum with some displacement anteriorly and inferiorly. Suggestion of some associated periosteal stripping. Findings best appreciated on the axial sequence, series 3. Associated subacute Hill-Sachs deformity. The joint spaces are unremarkable.   IMPRESSION: Complex tear to the anterior/inferior labrum with mild displacement anteriorly and inferiorly. Suggestion of some associated periosteal stripping.   Subacute appearing Hill-Sachs deformity.   Electronically signed by: Reyes Frees MD 04/19/2024 03:06 PM EDT RP Workstation: [id number]  PATIENT SURVEYS:  Quick Dash=95.5  COGNITION: Overall cognitive status: Within functional limits for tasks assessed     SENSATION: WFL  POSTURE: Forward head, rounded shoulders, decreased shoulder muscle mass/bulk  UPPER EXTREMITY ROM: Not test on evaluation visit due to restrictions  Passive ROM Right eval Left eval  Shoulder flexion    Shoulder extension    Shoulder abduction    Shoulder adduction    Shoulder internal rotation    Shoulder external rotation    Elbow flexion 170 degrees = BUE   Elbow extension    Wrist flexion    Wrist extension    Wrist ulnar deviation    Wrist radial deviation    Wrist pronation    Wrist supination    (Blank rows = not tested)   UPPER EXTREMITY MMT: NOT tested on evaluation visit due to restrictions  MMT Right eval Left eval  Shoulder flexion    Shoulder extension    Shoulder abduction    Shoulder adduction    Shoulder internal rotation    Shoulder external rotation    Middle trapezius    Lower trapezius    Elbow flexion    Elbow extension    Wrist flexion    Wrist extension    Wrist ulnar deviation    Wrist radial deviation    Wrist  pronation    Wrist supination    Grip strength (  lbs)    (Blank rows = not tested)  PALPATION:  TTP along the R upper traps and around the R shoulder in general    TODAY'S TREATMENT:  Wound Care:  Post op bandage is removed exposing 3 port sites w/ 2 sutures per site.  There are all covered today with bandaids.  Patient is to obtain Tegaderm w/ pad after leaving clinic today and replace the bandaids with this dressing prior to any showering.  She is instructed not to get her surgery sites wet at all or do any submerging of the R shoulder in water.    Initial HEP instruction.  Patient cannot perform isometric exercises correctly in clinic today so they are not given to her for part of her HEP yet.  She tends to try to rock her body weight forward and lean into the LUE which is not advisable.  She will need further teaching on isometrics in clinic due to inability to safely do them at this time  SELF CARE: Provided education to increase independence with ADLs, to facilitate performance of basic household cleaning/chores, on strategies for edema management, on signs of infection, and infection prevention changing tegarm pad bandages with standard precautions/handwashing before and after/not getting incisions wet or submerging them/keeping them clean/dry/covered; sling wear time is 24 hr/day except for hygiene and clothing changes.    PATIENT EDUCATION:  Education details: PT eval findings, anticipated POC, and initial HEP  Person educated: Patient Education method: Explanation, Demonstration, Verbal cues, Tactile cues, and MedBridgeGO app access provided Education comprehension: verbalized understanding, verbal cues required, tactile cues required, and needs further education  HOME EXERCISE PROGRAM: Access Code: [email or web address] URL: [email or web address] Date: 05/21/2024 Prepared by: Garnette Montclair  Exercises - Seated Shoulder Cradle Shrug  - 3 x daily - 7 x weekly - 3 sets - 10  reps - Seated Elbow Flexion AAROM  - 3 x daily - 7 x weekly - 3 sets - 10 reps - Seated AAROM Wrist Flexion/Extension with Clasped Hands  - 3 x daily - 7 x weekly - 3 sets - 10 reps - Forearm Strengthening with Ball Squeeze  - 3 x daily - 7 x weekly - 3 sets - 10 reps   ASSESSMENT:  CLINICAL IMPRESSION: Ashley Stout is a 33 y.o. female who was referred to physical therapy for evaluation and treatment for Right shoulder recurrent instability and dislocations requiring Right shoulder arthroscopic inferior labral repair with rotator cuff repair/remplissage on 7/10 by Dr Genelle.   Patient is to wear her sling at all times for 4-6 weeks per Dr Danetta written labral repair protocol and this is strongly emphasized to her today.  She is having pain at all times, but worse when her RUE is in unsupported positions.  She is instructed to try sleeping in recliner rather than bed and to keep a pillow under her R arm for support and wear her sling as instructed.  Pain level is as would be expected for her procedure.  Her scope sites all look good with no redness.  Edema is not that severe.  She is not to do any shoulder ROM at this time in her recovery per protocol and this is emphasized to her today.   Patient is advised to go the pharmacy when she leaves the clinic today and obtain a box of tegaderm with pad dressings and apply them to each of the scope sites.  She is advised not to get the surgery sites wet and no bathing until  they are covered with tegaderm.  Patient has expected deficits in L shoulder ROM and strength although formal ROM/strength Testing is not done today due to restrictions.  She does have pain, forward head/rounded shoulder posture, decreased HEP knowledge, decreased positional/body awareness which are interfering with ADLs and are impacting quality of life.  On QuickDASH patient scored 95.5/100 demonstrating 95.5% disability.  [place] will benefit from skilled PT to address above deficits to  improve mobility and activity tolerance with decreased pain interference.She is advised not to do any subluxing motion at all with the R shoulder and to wear her sling all times.   OBJECTIVE IMPAIRMENTS: decreased ROM, decreased strength, impaired flexibility, impaired UE functional use, postural dysfunction, and pain.   ACTIVITY LIMITATIONS: carrying, lifting, bathing, toileting, dressing, self feeding, reach over head, and hygiene/grooming  PARTICIPATION LIMITATIONS: meal prep, cleaning, laundry, driving, and occupation  PERSONAL FACTORS: cost of coming to therapy/high copay are also affecting patient's functional outcome.   REHAB POTENTIAL: Good  CLINICAL DECISION MAKING: Evolving/moderate complexity  EVALUATION COMPLEXITY: Low   GOALS: Goals reviewed with patient? Yes  SHORT TERM GOALS: Target date: 07/02/2024  Patient will be independent with initial HEP to improve outcomes and carryover.  Baseline: 100% PT assist required for completion;  unable to do isometrics correctly Goal status: INITIAL  2.  Patient will report 25% improvement in R shoulder pain to improve QOL.  Baseline: 8/10 worst; 5/10 constant baseline pain Goal status: INITIAL   LONG TERM GOALS: Target date: 08/13/2024  Patient will be independent with ongoing/advanced HEP for self-management at home.  Baseline: NO advanced HEP given yet  Goal status: INITIAL  2.  Patient will report 50-75% improvement in R shoulder pain to improve QOL.  Baseline: 8/10 worst Goal status: INITIAL  3.  Patient to demonstrate improved upright posture with posterior shoulder girdle engaged to promote improved functional UE use. Baseline: forward head/rounded shoulder w/ poor awareness of posture Goal status: INITIAL  4.  Patient to improve R shoulder  AROM to able to reach overhead (when protocol allows) with good shoulder/scapular positiong without pain provocation to allow for increased ease of ADLs.  Baseline: unable to  test on eval due to surgical restrictions Goal status: INITIAL  5.  Patient will demonstrate improved R shoulder strength to >/= 4+/5  (when protocol allows) for functional UE use and to be able to return to work full duty. Baseline: No strength testing due to surgical restrictions Goal status: INITIAL  6  Patient will report </= 50 % on QuickDASH (MCID = 14%) overall more so to demonstrate improved functional ability.  Baseline: 95% Goal status: INITIAL  7.  Patient will be able to lift 20-50 lbs from floor to waist height for return to work   Baseline: NO LIFTING/ No R shoulder ROM yet Goal status: INITIAL    PLAN:  PT FREQUENCY: 1-2x/week  PT DURATION: 12 weeks  PLANNED INTERVENTIONS: 97164- PT Re-evaluation, 97750- Physical Performance Testing, 97110-Therapeutic exercises, 97530- Therapeutic activity, [id number]- Neuromuscular re-education, 97535- Self Care, 02859- Manual therapy, [id number]- Electrical stimulation (unattended), 97016- Vasopneumatic device, [id number]- Ultrasound, [id number]- Ionotophoresis 4mg /ml Dexamethasone , 79439 (1-2 muscles), 20561 (3+ muscles)- Dry Needling, Patient/Family education, Taping, Cryotherapy, and Moist heat  PLAN FOR NEXT SESSION: Progress per Dr Danetta protocol   RED SENIOR, PT 05/22/2024, 10:01 AM

## 2024-05-21 ENCOUNTER — Telehealth (HOSPITAL_BASED_OUTPATIENT_CLINIC_OR_DEPARTMENT_OTHER): Payer: Self-pay | Admitting: Orthopaedic Surgery

## 2024-05-21 ENCOUNTER — Other Ambulatory Visit: Payer: Self-pay

## 2024-05-21 ENCOUNTER — Ambulatory Visit: Attending: Orthopaedic Surgery | Admitting: Rehabilitation

## 2024-05-21 ENCOUNTER — Encounter: Payer: Self-pay | Admitting: Rehabilitation

## 2024-05-21 DIAGNOSIS — M25511 Pain in right shoulder: Secondary | ICD-10-CM | POA: Insufficient documentation

## 2024-05-21 DIAGNOSIS — S43014D Anterior dislocation of right humerus, subsequent encounter: Secondary | ICD-10-CM | POA: Insufficient documentation

## 2024-05-21 DIAGNOSIS — M6281 Muscle weakness (generalized): Secondary | ICD-10-CM | POA: Insufficient documentation

## 2024-05-21 DIAGNOSIS — M25611 Stiffness of right shoulder, not elsewhere classified: Secondary | ICD-10-CM | POA: Insufficient documentation

## 2024-05-21 NOTE — Telephone Encounter (Signed)
 Patient wants to have Pt at [facility] but will go to [place] today for her first visit.

## 2024-05-26 NOTE — Therapy (Deleted)
 OUTPATIENT PHYSICAL THERAPY UPPER EXTREMITY TREATMENT  Patient Name: Ashley Stout MRN: [id number] DOB:09-14-1991, 33 y.o., female Today's Date: 05/26/2024  END OF SESSION:    Past Medical History:  Diagnosis Date   Diabetes (HCC) 10/2019   Past Surgical History:  Procedure Laterality Date   INDUCED ABORTION     There are no active problems to display for this patient.   PCP: Patient, No Pcp Per   REFERRING PROVIDER: Genelle Standing, MD    REFERRING DIAG: S43.014D (ICD-10-CM) - Anterior dislocation of right shoulder, subsequent encounter  THERAPY DIAG:  No diagnosis found.  RATIONALE FOR EVALUATION AND TREATMENT: Rehabilitation  ONSET DATE: Surgery 05/17/24  NEXT MD VISIT: 06/04/24   SUBJECTIVE:                                                                                                                                                                                                         SUBJECTIVE STATEMENT: EVAL 05/22/24:  Patient is referred to PT from Dr Genelle following a R shoulder inferior labral repair from  3:00 to 9:00 with remplissage of the infraspinatus and posterior capsule to Hill Sach's defect.    Patient has a h/o multiple dislocations of the R shoulder since she was a child  with resultant pain and instability.   She reports she is double-jointed and demonstrates subluxations of multiple joints to me on today's evaluation.    She has orders for bandage change today and her post op dressing is removed.  There are 2 sutures in each of the 3 scope sites that are intact.   There is no wound drainage/redness/dehischence noted.    Patient is advised to go the pharmacy when she leaves the clinic today and obtain a box of tegaderm with pad dressings and apply them to each of the scope sites.  She is advised not to get the surgery sites wet and no bathing until they are covered with tegaderm.   There is some R shoulder edema, but not severe.  Patient reports has  been icing the shoulder regularly.  States sleep is painful and has been trying to sleep on her back in the bed.  She verbalizes understanding of not rolling onto the R shoulder and to use a pillow prop under her arm to keep it from extending beyond neutral.    PAIN: Are you having pain? Yes: NPRS scale: 5/10 now; 8/10 worst Pain location: R shoulder Pain description: sore, aching, radiating into mid to distal humerus, sharp at times Aggravating factors: any unsupported positions of the R  shoulder Relieving factors: ice, meds  PERTINENT HISTORY:  Diabetes type 1  PRECAUTIONS: Other: labral repair/remplissage protocol  HAND DOMINANCE: Right  RED FLAGS: None  WEIGHT BEARING RESTRICTIONS: Yes NWB RUE during protection phases of recovery  FALLS:  Has patient fallen in last 6 months? 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   FINDINGS: The supraspinatus, infraspinatus, subscapularis, and teres minor tendons are unremarkable. No bursal fluid. No loose bodies. The biceps tendon is intact. There is a complex tear to the anterior/inferior labrum with some displacement anteriorly and inferiorly. Suggestion of some associated periosteal stripping. Findings best appreciated on the axial sequence, series 3.  Associated subacute Hill-Sachs deformity. The joint spaces are unremarkable.   IMPRESSION: Complex tear to the anterior/inferior labrum with mild displacement anteriorly and inferiorly. Suggestion of some associated periosteal stripping.   Subacute appearing Hill-Sachs deformity.   Electronically signed by: Reyes Frees MD 04/19/2024 03:06 PM EDT RP Workstation: [id number]  PATIENT SURVEYS:  Quick Dash=95.5  COGNITION: Overall cognitive status: Within functional limits for tasks assessed     SENSATION: WFL  POSTURE: Forward head, rounded shoulders, decreased shoulder muscle mass/bulk  UPPER EXTREMITY ROM: Not test on evaluation visit due to restrictions  Passive ROM Right eval Left eval  Shoulder flexion    Shoulder extension    Shoulder abduction    Shoulder adduction    Shoulder internal rotation    Shoulder external rotation    Elbow flexion 170 degrees = BUE   Elbow extension    Wrist flexion    Wrist extension    Wrist ulnar deviation    Wrist radial deviation    Wrist pronation    Wrist supination    (Blank rows = not tested)   UPPER EXTREMITY MMT: NOT tested on evaluation visit due to restrictions  MMT Right eval Left eval  Shoulder flexion    Shoulder extension    Shoulder abduction    Shoulder adduction    Shoulder internal rotation    Shoulder external rotation    Middle trapezius    Lower trapezius    Elbow flexion    Elbow extension    Wrist flexion    Wrist extension    Wrist ulnar deviation    Wrist radial deviation    Wrist pronation    Wrist supination    Grip strength (lbs)    (Blank rows = not tested)  PALPATION:  TTP along the R upper traps and around the R shoulder in general    TODAY'S TREATMENT:  Wound Care:  Post op bandage is removed exposing 3 port sites w/ 2 sutures per site.  There are all covered today with bandaids.  Patient is to obtain Tegaderm w/ pad after leaving clinic today and replace the bandaids with this  dressing prior to any showering.  She is instructed not to get her surgery  sites wet at all or do any submerging of the R shoulder in water.    Initial HEP instruction.  Patient cannot perform isometric exercises correctly in clinic today so they are not given to her for part of her HEP yet.  She tends to try to rock her body weight forward and lean into the LUE which is not advisable.  She will need further teaching on isometrics in clinic due to inability to safely do them at this time  SELF CARE: Provided education to increase independence with ADLs, to facilitate performance of basic household cleaning/chores, on strategies for edema management, on signs of infection, and infection prevention changing tegarm pad bandages with standard precautions/handwashing before and after/not getting incisions wet or submerging them/keeping them clean/dry/covered; sling wear time is 24 hr/day except for hygiene and clothing changes.    PATIENT EDUCATION:  Education details: PT eval findings, anticipated POC, and initial HEP  Person educated: Patient Education method: Explanation, Demonstration, Verbal cues, Tactile cues, and MedBridge[employer] app access provided Education comprehension: verbalized understanding, verbal cues required, tactile cues required, and needs further education  HOME EXERCISE PROGRAM: Access Code: [email or web address] URL: [email or web address] Date: 05/21/2024 Prepared by: Garnette Montclair  Exercises - Seated Shoulder Cradle Shrug  - 3 x daily - 7 x weekly - 3 sets - 10 reps - Seated Elbow Flexion AAROM  - 3 x daily - 7 x weekly - 3 sets - 10 reps - Seated AAROM Wrist Flexion/Extension with Clasped Hands  - 3 x daily - 7 x weekly - 3 sets - 10 reps - Forearm Strengthening with Ball Squeeze  - 3 x daily - 7 x weekly - 3 sets - 10 reps   ASSESSMENT:  CLINICAL IMPRESSION: Ashley Stout is a 33 y.o. female who was referred to physical therapy for evaluation and treatment for  Right shoulder recurrent instability and dislocations requiring Right shoulder arthroscopic inferior labral repair with rotator cuff repair/remplissage on 7/10 by Dr Genelle.   Patient is to wear her sling at all times for 4-6 weeks per Dr Danetta written labral repair protocol and this is strongly emphasized to her today.  She is having pain at all times, but worse when her RUE is in unsupported positions.  She is instructed to try sleeping in recliner rather than bed and to keep a pillow under her R arm for support and wear her sling as instructed.  Pain level is as would be expected for her procedure.  Her scope sites all look good with no redness.  Edema is not that severe.  She is not to do any shoulder ROM at this time in her recovery per protocol and this is emphasized to her today.   Patient is advised to go the pharmacy when she leaves the clinic today and obtain a box of tegaderm with pad dressings and apply them to each of the scope sites.  She is advised not to get the surgery sites wet and no bathing until they are covered with tegaderm.  Patient has expected deficits in L shoulder ROM and strength although formal ROM/strength Testing is not done today due to restrictions.  She does have pain, forward head/rounded shoulder posture, decreased HEP knowledge, decreased positional/body awareness which are interfering with ADLs and are impacting quality of life.  On QuickDASH patient scored 95.5/100 demonstrating 95.5% disability.  Ashley Stout will benefit from skilled PT to address above deficits to improve mobility and activity tolerance with decreased pain interference.She is advised not to do  any subluxing motion at all with the R shoulder and to wear her sling all times.   OBJECTIVE IMPAIRMENTS: decreased ROM, decreased strength, impaired flexibility, impaired UE functional use, postural dysfunction, and pain.   ACTIVITY LIMITATIONS: carrying, lifting, bathing, toileting, dressing, self feeding, reach  over head, and hygiene/grooming  PARTICIPATION LIMITATIONS: meal prep, cleaning, laundry, driving, and occupation  PERSONAL FACTORS: cost of coming to therapy/high copay are also affecting patient's functional outcome.   REHAB POTENTIAL: Good  CLINICAL DECISION MAKING: Evolving/moderate complexity  EVALUATION COMPLEXITY: Low   GOALS: Goals reviewed with patient? Yes  SHORT TERM GOALS: Target date: 07/02/2024  Patient will be independent with initial HEP to improve outcomes and carryover.  Baseline: 100% PT assist required for completion;  unable to do isometrics correctly Goal status: INITIAL  2.  Patient will report 25% improvement in R shoulder pain to improve QOL.  Baseline: 8/10 worst; 5/10 constant baseline pain Goal status: INITIAL   LONG TERM GOALS: Target date: 08/13/2024  Patient will be independent with ongoing/advanced HEP for self-management at home.  Baseline: NO advanced HEP given yet  Goal status: INITIAL  2.  Patient will report 50-75% improvement in R shoulder pain to improve QOL.  Baseline: 8/10 worst Goal status: INITIAL  3.  Patient to demonstrate improved upright posture with posterior shoulder girdle engaged to promote improved functional UE use. Baseline: forward head/rounded shoulder w/ poor awareness of posture Goal status: INITIAL  4.  Patient to improve R shoulder  AROM to able to reach overhead (when protocol allows) with good shoulder/scapular positiong without pain provocation to allow for increased ease of ADLs.  Baseline: unable to test on eval due to surgical restrictions Goal status: INITIAL  5.  Patient will demonstrate improved R shoulder strength to >/= 4+/5  (when protocol allows) for functional UE use and to be able to return to work full duty. Baseline: No strength testing due to surgical restrictions Goal status: INITIAL  6  Patient will report </= 50 % on QuickDASH (MCID = 14%) overall more so to demonstrate improved functional  ability.  Baseline: 95% Goal status: INITIAL  7.  Patient will be able to lift 20-50 lbs from floor to waist height for return to work   Baseline: NO LIFTING/ No R shoulder ROM yet Goal status: INITIAL    PLAN:  PT FREQUENCY: 1-2x/week  PT DURATION: 12 weeks  PLANNED INTERVENTIONS: 97164- PT Re-evaluation, 97750- Physical Performance Testing, 97110-Therapeutic exercises, 97530- Therapeutic activity, [id number]- Neuromuscular re-education, 97535- Self Care, [id number]- Manual therapy, [id number]- Electrical stimulation (unattended), 97016- Vasopneumatic device, [id number]- Ultrasound, [id number]- Ionotophoresis 4mg /ml Dexamethasone , 79439 (1-2 muscles), 20561 (3+ muscles)- Dry Needling, Patient/Family education, Taping, Cryotherapy, and Moist heat  PLAN FOR NEXT SESSION: Progress per Dr Danetta protocol   Ashley Stout, PT 05/26/2024, 7:50 AM

## 2024-05-29 ENCOUNTER — Ambulatory Visit (HOSPITAL_BASED_OUTPATIENT_CLINIC_OR_DEPARTMENT_OTHER): Attending: Orthopaedic Surgery | Admitting: Physical Therapy

## 2024-05-29 DIAGNOSIS — M6281 Muscle weakness (generalized): Secondary | ICD-10-CM | POA: Insufficient documentation

## 2024-05-29 DIAGNOSIS — M25511 Pain in right shoulder: Secondary | ICD-10-CM | POA: Insufficient documentation

## 2024-05-29 DIAGNOSIS — M25611 Stiffness of right shoulder, not elsewhere classified: Secondary | ICD-10-CM | POA: Insufficient documentation

## 2024-05-29 NOTE — Therapy (Addendum)
 OUTPATIENT PHYSICAL THERAPY UPPER EXTREMITY TREATMENT/ DISCHARGE   PHYSICAL THERAPY DISCHARGE SUMMARY  Visits from Start of Care: 2  Current functional level related to goals / functional outcomes: N/A    Remaining deficits: N/A   Education / Equipment: N/A   Patient agrees to discharge. Patient goals were not met. Patient is being discharged due to Pt was non compliant with PT.   Patient Name: Ashley Stout MRN: [id number] DOB:Feb 16, 1991, 33 y.o., female Today's Date: 05/31/2024  END OF SESSION:  PT End of Session - 05/31/24 1342     Visit Number 2    Date for PT Re-Evaluation 08/13/24    PT Start Time 0142    PT Stop Time 0220    PT Time Calculation (min) 38 min    Activity Tolerance Patient tolerated treatment well    Behavior During Therapy [facility] for tasks assessed/performed           Past Medical History:  Diagnosis Date   Diabetes (HCC) 10/2019   Past Surgical History:  Procedure Laterality Date   INDUCED ABORTION     There are no active problems to display for this patient.   PCP: Patient, No Pcp Per   REFERRING PROVIDER: Genelle Standing, MD    REFERRING DIAG: S43.014D (ICD-10-CM) - Anterior dislocation of right shoulder, subsequent encounter  THERAPY DIAG:  Acute pain of right shoulder  Stiffness of right shoulder, not elsewhere classified  Muscle weakness (generalized)  RATIONALE FOR EVALUATION AND TREATMENT: Rehabilitation  ONSET DATE: Surgery 05/17/24  NEXT MD VISIT: 06/04/24   SUBJECTIVE:                                                                                                                                                                                                         SUBJECTIVE STATEMENT: EVAL 05/22/24:  Patient is referred to PT from Dr Genelle following a R shoulder inferior labral repair from  3:00 to 9:00 with remplissage of the infraspinatus and posterior capsule to Hill Sach's defect.    Patient has a h/o multiple  dislocations of the R shoulder since she was a child  with resultant pain and instability.   She reports she is double-jointed and demonstrates subluxations of multiple joints to me on today's evaluation.    She has orders for bandage change today and her post op dressing is removed.  There are 2 sutures in each of the 3 scope sites that are intact.   There is no wound drainage/redness/dehischence noted.    Patient is advised to go the pharmacy when  she leaves the clinic today and obtain a box of tegaderm with pad dressings and apply them to each of the scope sites.  She is advised not to get the surgery sites wet and no bathing until they are covered with tegaderm.   There is some R shoulder edema, but not severe.  Patient reports has been icing the shoulder regularly.  States sleep is painful and has been trying to sleep on her back in the bed.  She verbalizes understanding of not rolling onto the R shoulder and to use a pillow prop under her arm to keep it from extending beyond neutral.    PAIN: Are you having pain? Yes: NPRS scale: 5/10 now; 8/10 worst Pain location: R shoulder Pain description: sore, aching, radiating into mid to distal humerus, sharp at times Aggravating factors: any unsupported positions of the R shoulder Relieving factors: ice, meds  PERTINENT HISTORY:  Diabetes type 1  PRECAUTIONS: Other: labral repair/remplissage protocol  HAND DOMINANCE: Right  RED FLAGS: None  WEIGHT BEARING RESTRICTIONS: Yes NWB RUE during protection phases of recovery  FALLS:  Has patient fallen in last 6 months? 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   FINDINGS: The supraspinatus, infraspinatus, subscapularis, and teres minor tendons are unremarkable. No bursal fluid. No loose bodies. The biceps tendon is intact. There is a complex tear to the anterior/inferior labrum with some displacement anteriorly and inferiorly. Suggestion of some associated periosteal stripping. Findings best appreciated on the axial sequence, series 3. Associated subacute Hill-Sachs deformity. The joint spaces are unremarkable.   IMPRESSION: Complex tear to the anterior/inferior labrum with mild displacement anteriorly and inferiorly. Suggestion of some associated periosteal stripping.   Subacute appearing Hill-Sachs deformity.   Electronically signed by: Reyes Frees MD 04/19/2024 03:06 PM EDT RP Workstation: [id number]  PATIENT SURVEYS:  Quick Dash=95.5  COGNITION: Overall cognitive status: Within functional limits for tasks assessed     SENSATION: WFL  POSTURE: Forward head, rounded shoulders, decreased shoulder muscle mass/bulk  UPPER EXTREMITY ROM: Not test on evaluation visit due to restrictions  Passive ROM Right eval Left eval  Shoulder flexion    Shoulder extension    Shoulder abduction    Shoulder adduction    Shoulder internal rotation    Shoulder external rotation    Elbow flexion 170 degrees = BUE   Elbow extension    Wrist flexion    Wrist extension    Wrist ulnar deviation    Wrist radial deviation    Wrist pronation    Wrist supination    (Blank rows = not tested)   UPPER EXTREMITY MMT: NOT tested on evaluation visit due to restrictions  MMT Right eval  Left eval  Shoulder flexion    Shoulder extension    Shoulder abduction    Shoulder adduction    Shoulder internal rotation    Shoulder  external rotation    Middle trapezius    Lower trapezius    Elbow flexion    Elbow extension    Wrist flexion    Wrist extension    Wrist ulnar deviation    Wrist radial deviation    Wrist pronation    Wrist supination    Grip strength (lbs)    (Blank rows = not tested)  PALPATION:  TTP along the R upper traps and around the R shoulder in general    TODAY'S TREATMENT:  05/31/2024: - PROM into flexion, abduction, ER within protocol.  - STM to biceps, UT - Active elbow flexion/ extension - UT stretch - LS stretch  - Isometrics into flexion, ER, IR, Ext, x10, 5 sec hold    Wound Care:  Post op bandage is removed exposing 3 port sites w/ 2 sutures per site.  There are all covered today with bandaids.  Patient is to obtain Tegaderm w/ pad after leaving clinic today and replace the bandaids with this dressing prior to any showering.  She is instructed not to get her surgery sites wet at all or do any submerging of the R shoulder in water.    Initial HEP instruction.  Patient cannot perform isometric exercises correctly in clinic today so they are not given to her for part of her HEP yet.  She tends to try to rock her body weight forward and lean into the LUE which is not advisable.  She will need further teaching on isometrics in clinic due to inability to safely do them at this time  SELF CARE: Provided education to increase independence with ADLs, to facilitate performance of basic household cleaning/chores, on strategies for edema management, on signs of infection, and infection prevention changing tegarm pad bandages with standard precautions/handwashing before and after/not getting incisions wet or submerging them/keeping them clean/dry/covered; sling wear time is 24 hr/day except for hygiene and clothing changes.    PATIENT EDUCATION:   Education details: PT eval findings, anticipated POC, and initial HEP  Person educated: Patient Education method: Explanation, Demonstration, Verbal cues, Tactile cues, and MedBridgeGO app access provided Education comprehension: verbalized understanding, verbal cues required, tactile cues required, and needs further education  HOME EXERCISE PROGRAM: Access Code: [email or web address] URL: [email or web address] Date: 05/21/2024 Prepared by: Garnette Montclair  Exercises - Seated Shoulder Cradle Shrug  - 3 x daily - 7 x weekly - 3 sets - 10 reps - Seated Elbow Flexion AAROM  - 3 x daily - 7 x weekly - 3 sets - 10 reps - Seated AAROM Wrist Flexion/Extension with Clasped Hands  - 3 x daily - 7 x weekly - 3 sets - 10 reps - Forearm Strengthening with Ball Squeeze  - 3 x daily - 7 x weekly - 3 sets - 10 reps   ASSESSMENT:  CLINICAL IMPRESSION: Pt arrives at Pt with sling donned. Reviewed precautions, discussed protocol and progressions per pt request. Pt tolerated all prescribed exercises and PROM well today within protocol. She requires cues for proper form with isometrics. Pt will continue to benefit from skilled PT to address continued deficits.   08/16/2025Pt has been discharged due to continued non compliance with PT.    Kynnedi I Derner is a 33 y.o. female who was referred to physical therapy for evaluation and treatment for Right shoulder recurrent instability and dislocations requiring Right shoulder arthroscopic inferior labral repair with rotator cuff repair/remplissage on 7/10 by Dr Genelle.   Patient is to wear her sling at all times  for 4-6 weeks per Dr Danetta written labral repair protocol and this is strongly emphasized to her today.  She is having pain at all times, but worse when her RUE is in unsupported positions.  She is instructed to try sleeping in recliner rather than bed and to keep a pillow under her R arm for support and wear her sling as instructed.  Pain level is  as would be expected for her procedure.  Her scope sites all look good with no redness.  Edema is not that severe.  She is not to do any shoulder ROM at this time in her recovery per protocol and this is emphasized to her today.   Patient is advised to go the pharmacy when she leaves the clinic today and obtain a box of tegaderm with pad dressings and apply them to each of the scope sites.  She is advised not to get the surgery sites wet and no bathing until they are covered with tegaderm.  Patient has expected deficits in L shoulder ROM and strength although formal ROM/strength Testing is not done today due to restrictions.  She does have pain, forward head/rounded shoulder posture, decreased HEP knowledge, decreased positional/body awareness which are interfering with ADLs and are impacting quality of life.  On QuickDASH patient scored 95.5/100 demonstrating 95.5% disability.  Jelena will benefit from skilled PT to address above deficits to improve mobility and activity tolerance with decreased pain interference.She is advised not to do any subluxing motion at all with the R shoulder and to wear her sling all times.   OBJECTIVE IMPAIRMENTS: decreased ROM, decreased strength, impaired flexibility, impaired UE functional use, postural dysfunction, and pain.   ACTIVITY LIMITATIONS: carrying, lifting, bathing, toileting, dressing, self feeding, reach over head, and hygiene/grooming  PARTICIPATION LIMITATIONS: meal prep, cleaning, laundry, driving, and occupation  PERSONAL FACTORS: cost of coming to therapy/high copay are also affecting patient's functional outcome.   REHAB POTENTIAL: Good  CLINICAL DECISION MAKING: Evolving/moderate complexity  EVALUATION COMPLEXITY: Low   GOALS: Goals reviewed with patient? Yes  SHORT TERM GOALS: Target date: 07/02/2024  Patient will be independent with initial HEP to improve outcomes and carryover.  Baseline: 100% PT assist required for completion;  unable to  do isometrics correctly Goal status: INITIAL  2.  Patient will report 25% improvement in R shoulder pain to improve QOL.  Baseline: 8/10 worst; 5/10 constant baseline pain Goal status: INITIAL   LONG TERM GOALS: Target date: 08/13/2024  Patient will be independent with ongoing/advanced HEP for self-management at home.  Baseline: NO advanced HEP given yet  Goal status: INITIAL  2.  Patient will report 50-75% improvement in R shoulder pain to improve QOL.  Baseline: 8/10 worst Goal status: INITIAL  3.  Patient to demonstrate improved upright posture with posterior shoulder girdle engaged to promote improved functional UE use. Baseline: forward head/rounded shoulder w/ poor awareness of posture Goal status: INITIAL  4.  Patient to improve R shoulder  AROM to able to reach overhead (when protocol allows) with good shoulder/scapular positiong without pain provocation to allow for increased ease of ADLs.  Baseline: unable to test on eval due to surgical restrictions Goal status: INITIAL  5.  Patient will demonstrate improved R shoulder strength to >/= 4+/5  (when protocol allows) for functional UE use and to be able to return to work full duty. Baseline: No strength testing due to surgical restrictions Goal status: INITIAL  6  Patient will report </= 50 % on QuickDASH (MCID =  14%) overall more so to demonstrate improved functional ability.  Baseline: 95% Goal status: INITIAL  7.  Patient will be able to lift 20-50 lbs from floor to waist height for return to work   Baseline: NO LIFTING/ No R shoulder ROM yet Goal status: INITIAL     Rojean JONELLE Batten, PT 05/31/2024, 2:16 PM

## 2024-05-31 ENCOUNTER — Encounter (HOSPITAL_BASED_OUTPATIENT_CLINIC_OR_DEPARTMENT_OTHER): Payer: Self-pay | Admitting: Physical Therapy

## 2024-05-31 ENCOUNTER — Ambulatory Visit (HOSPITAL_BASED_OUTPATIENT_CLINIC_OR_DEPARTMENT_OTHER): Admitting: Physical Therapy

## 2024-05-31 DIAGNOSIS — M25511 Pain in right shoulder: Secondary | ICD-10-CM

## 2024-05-31 DIAGNOSIS — M6281 Muscle weakness (generalized): Secondary | ICD-10-CM | POA: Diagnosis not present

## 2024-05-31 DIAGNOSIS — M25611 Stiffness of right shoulder, not elsewhere classified: Secondary | ICD-10-CM

## 2024-06-04 ENCOUNTER — Ambulatory Visit (HOSPITAL_BASED_OUTPATIENT_CLINIC_OR_DEPARTMENT_OTHER): Admitting: Physician Assistant

## 2024-06-04 ENCOUNTER — Ambulatory Visit (HOSPITAL_BASED_OUTPATIENT_CLINIC_OR_DEPARTMENT_OTHER): Admitting: Physical Therapy

## 2024-06-04 ENCOUNTER — Encounter (HOSPITAL_BASED_OUTPATIENT_CLINIC_OR_DEPARTMENT_OTHER): Admitting: Orthopaedic Surgery

## 2024-06-04 ENCOUNTER — Encounter (HOSPITAL_BASED_OUTPATIENT_CLINIC_OR_DEPARTMENT_OTHER): Payer: Self-pay | Admitting: Physician Assistant

## 2024-06-04 DIAGNOSIS — M25511 Pain in right shoulder: Secondary | ICD-10-CM | POA: Insufficient documentation

## 2024-06-04 DIAGNOSIS — G8918 Other acute postprocedural pain: Secondary | ICD-10-CM

## 2024-06-04 NOTE — Therapy (Incomplete)
 OUTPATIENT PHYSICAL THERAPY UPPER EXTREMITY TREATMENT  Patient Name: SOLVEIG FANGMAN MRN: [id number] DOB:[birth year], 33 y.o., female Today's Date: 06/04/2024  END OF SESSION:     Past Medical History:  Diagnosis Date   Diabetes (HCC) 10/2019   Past Surgical History:  Procedure Laterality Date   INDUCED ABORTION     There are no active problems to display for this patient.   PCP: Patient, No Pcp Per   REFERRING PROVIDER: Genelle Standing, MD    REFERRING DIAG: S43.014D (ICD-10-CM) - Anterior dislocation of right shoulder, subsequent encounter  THERAPY DIAG:  No diagnosis found.  RATIONALE FOR EVALUATION AND TREATMENT: Rehabilitation  ONSET DATE: Surgery 05/17/24  NEXT MD VISIT: 06/04/24   SUBJECTIVE:                                                                                                                                                                                                         SUBJECTIVE STATEMENT: ***  PAIN: Are you having pain? Yes: NPRS scale: 5/10 now; 8/10 worst Pain location: R shoulder Pain description: sore, aching, radiating into mid to distal humerus, sharp at times Aggravating factors: any unsupported positions of the R shoulder Relieving factors: ice, meds  PERTINENT HISTORY:  Diabetes type 1  PRECAUTIONS: Other: labral repair/remplissage protocol  HAND DOMINANCE: Right  RED FLAGS: None  WEIGHT BEARING RESTRICTIONS: Yes NWB RUE during protection phases of recovery  FALLS:  Has patient fallen in last 6 months? 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   FINDINGS: The supraspinatus, infraspinatus, subscapularis, and teres minor tendons are unremarkable. No bursal fluid. No loose bodies. The biceps tendon is intact. There is a complex tear to the anterior/inferior labrum with some displacement anteriorly and inferiorly. Suggestion of some associated periosteal stripping. Findings best appreciated on the axial sequence, series 3. Associated subacute Hill-Sachs deformity. The joint spaces are unremarkable.   IMPRESSION: Complex tear to the anterior/inferior  labrum with mild displacement anteriorly and inferiorly. Suggestion of some associated periosteal stripping.   Subacute appearing Hill-Sachs deformity.   Electronically signed by: Reyes Frees MD 04/19/2024 03:06 PM EDT RP Workstation: [id number]  PATIENT SURVEYS:  Quick Dash=95.5  COGNITION: Overall cognitive status: Within functional limits for tasks assessed     SENSATION: WFL  POSTURE: Forward head, rounded shoulders, decreased shoulder muscle mass/bulk  UPPER EXTREMITY ROM: Not test on evaluation visit due to restrictions  Passive ROM Right eval Left eval  Shoulder flexion    Shoulder extension    Shoulder abduction    Shoulder adduction    Shoulder internal rotation    Shoulder external rotation    Elbow flexion 170 degrees = BUE   Elbow extension    Wrist flexion    Wrist extension    Wrist ulnar deviation    Wrist radial deviation    Wrist pronation    Wrist supination    (Blank rows = not tested)   UPPER EXTREMITY MMT: NOT tested on evaluation visit due to restrictions  MMT Right eval Left eval  Shoulder flexion    Shoulder extension     Shoulder abduction    Shoulder adduction    Shoulder internal rotation    Shoulder external rotation    Middle trapezius    Lower trapezius    Elbow flexion    Elbow extension    Wrist flexion    Wrist extension    Wrist ulnar deviation    Wrist radial deviation    Wrist pronation    Wrist supination    Grip strength (lbs)    (Blank rows = not tested)  PALPATION:  TTP along the R upper traps and around the R shoulder in general    TODAY'S TREATMENT:  06/04/24 ***    PATIENT EDUCATION:  Education details: PT eval findings, anticipated POC, and initial HEP  Person educated: Patient Education method: Explanation, Demonstration, Verbal cues, Tactile cues, and MedBridgeGO app access provided Education comprehension: verbalized understanding, verbal cues required, tactile cues required, and needs further education  HOME EXERCISE PROGRAM: Access Code: [email or web address] URL: [email or web address]   ASSESSMENT:  CLINICAL IMPRESSION: ***   Deshon I Kube is a 33 y.o. female who was referred to physical therapy for evaluation and treatment for Right shoulder recurrent instability and dislocations requiring Right shoulder arthroscopic inferior labral repair with rotator cuff repair/remplissage on 7/10 by Dr Genelle.   Patient is to wear her sling at all times for 4-6 weeks per Dr Danetta written labral repair protocol and this is strongly emphasized to her today.  She is having pain at all times, but worse when her RUE is in unsupported positions.  She is instructed to try sleeping in recliner rather than bed and to keep a pillow under her R arm for support and wear her sling as instructed.  Pain level is as would be expected for her procedure.  Her scope sites all look good with no redness.  Edema is not that severe.  She is not to do any shoulder ROM at this time in her recovery per protocol and this is emphasized to her today.   Patient is advised to go the pharmacy when  she leaves the clinic today and obtain a box of tegaderm with pad dressings and apply them to each of the scope sites.  She is advised not to get the surgery sites wet and no bathing until they are covered with tegaderm.  Patient has expected deficits  in L shoulder ROM and strength although formal ROM/strength Testing is not done today due to restrictions.  She does have pain, forward head/rounded shoulder posture, decreased HEP knowledge, decreased positional/body awareness which are interfering with ADLs and are impacting quality of life.  On QuickDASH patient scored 95.5/100 demonstrating 95.5% disability.  Naomee will benefit from skilled PT to address above deficits to improve mobility and activity tolerance with decreased pain interference.She is advised not to do any subluxing motion at all with the R shoulder and to wear her sling all times.   OBJECTIVE IMPAIRMENTS: decreased ROM, decreased strength, impaired flexibility, impaired UE functional use, postural dysfunction, and pain.   ACTIVITY LIMITATIONS: carrying, lifting, bathing, toileting, dressing, self feeding, reach over head, and hygiene/grooming  PARTICIPATION LIMITATIONS: meal prep, cleaning, laundry, driving, and occupation  PERSONAL FACTORS: cost of coming to therapy/high copay are also affecting patient's functional outcome.   REHAB POTENTIAL: Good  CLINICAL DECISION MAKING: Evolving/moderate complexity  EVALUATION COMPLEXITY: Low   GOALS: Goals reviewed with patient? Yes  SHORT TERM GOALS: Target date: 07/02/2024  Patient will be independent with initial HEP to improve outcomes and carryover.  Baseline: 100% PT assist required for completion;  unable to do isometrics correctly Goal status: INITIAL  2.  Patient will report 25% improvement in R shoulder pain to improve QOL.  Baseline: 8/10 worst; 5/10 constant baseline pain Goal status: INITIAL   LONG TERM GOALS: Target date: 08/13/2024  Patient will be independent  with ongoing/advanced HEP for self-management at home.  Baseline: NO advanced HEP given yet  Goal status: INITIAL  2.  Patient will report 50-75% improvement in R shoulder pain to improve QOL.  Baseline: 8/10 worst Goal status: INITIAL  3.  Patient to demonstrate improved upright posture with posterior shoulder girdle engaged to promote improved functional UE use. Baseline: forward head/rounded shoulder w/ poor awareness of posture Goal status: INITIAL  4.  Patient to improve R shoulder  AROM to able to reach overhead (when protocol allows) with good shoulder/scapular positiong without pain provocation to allow for increased ease of ADLs.  Baseline: unable to test on eval due to surgical restrictions Goal status: INITIAL  5.  Patient will demonstrate improved R shoulder strength to >/= 4+/5  (when protocol allows) for functional UE use and to be able to return to work full duty. Baseline: No strength testing due to surgical restrictions Goal status: INITIAL  6  Patient will report </= 50 % on QuickDASH (MCID = 14%) overall more so to demonstrate improved functional ability.  Baseline: 95% Goal status: INITIAL  7.  Patient will be able to lift 20-50 lbs from floor to waist height for return to work   Baseline: NO LIFTING/ No R shoulder ROM yet Goal status: INITIAL    PLAN:  PT FREQUENCY: 1-2x/week  PT DURATION: 12 weeks  PLANNED INTERVENTIONS: 97164- PT Re-evaluation, 97750- Physical Performance Testing, 97110-Therapeutic exercises, 97530- Therapeutic activity, [id number]- Neuromuscular re-education, 97535- Self Care, [id number]- Manual therapy, [id number]- Electrical stimulation (unattended), 97016- Vasopneumatic device, [id number]- Ultrasound, [id number]- Ionotophoresis 4mg /ml Dexamethasone , 79439 (1-2 muscles), 20561 (3+ muscles)- Dry Needling, Patient/Family education, Taping, Cryotherapy, and Moist heat  PLAN FOR NEXT SESSION: Progress per Dr Danetta protocol   Harlene Cordon, PT,  DPT 06/04/2024, 2:52 PM

## 2024-06-04 NOTE — Progress Notes (Signed)
 Office Visit Note   Patient: Ashley Stout           Date of Birth: [birth year]           MRN: [id number] Visit Date: 06/04/2024              Requested by: No referring provider defined for this encounter. PCP: Patient, No Pcp Per  Chief Complaint  Patient presents with   Right Shoulder - Routine Post Op      HPI: Patient is a pleasant 33 year old woman right-hand-dominant patient of Dr. Genelle.  She is approximately 2-1/2 weeks status post right shoulder arthroscopy a repair of inferior labral tear and rotator cuff repair.  She is here for her first follow-up she denies any fever or chills.  She is already started working with physical therapy   Assessment & Plan: Visit Diagnoses:  1. Acute postoperative pain of right shoulder     Plan: She is doing very well she will continue with physical therapy she has been given exercises to do at home.  Sutures were removed today.  She may shower but should not submerge her right shoulder follow-up in 4 weeks  Follow-Up Instructions: Return in about 4 weeks (around 07/02/2024).   Ortho Exam  Patient is alert, oriented, no adenopathy, well-dressed, normal affect, normal respiratory effort. Examination of the right shoulder incisions have healed.  She has minimal swelling compartments are soft and nontender no redness no erythema she has good extension and flexion of her arm.  Grip strength is intact    Imaging: No results found. No images are attached to the encounter.  Labs: Lab Results  Component Value Date   REPTSTATUS 05/21/2015 FINAL 05/19/2015   CULT  05/19/2015    MULTIPLE SPECIES PRESENT, SUGGEST RECOLLECTION IF CLINICALLY INDICATED Performed at [facility]    [facility] KLEBSIELLA PNEUMONIAE 10/20/2013     Lab Results  Component Value Date   ALBUMIN 3.6 01/06/2024   ALBUMIN 3.0 (L) 02/21/2014   ALBUMIN 4.2 01/12/2013    No results found for: MG No results found for: VD25OH  No results found for:  PREALBUMIN    Latest Ref Rng & Units 03/30/2024   11:40 PM 01/06/2024    6:49 PM 05/19/2015    3:40 AM  CBC EXTENDED  WBC 4.0 - 10.5 K/uL 6.4  6.4  7.6   RBC 3.87 - 5.11 MIL/uL 4.53  4.16  3.76   Hemoglobin 12.0 - 15.0 g/dL 85.6  86.6  88.3   HCT 36.0 - 46.0 % 41.3  37.3  32.5   Platelets 150 - 400 K/uL 289  290  270   NEUT# 1.7 - 7.7 K/uL 3.5  3.9    Lymph# 0.7 - 4.0 K/uL 2.1  1.8       There is no height or weight on file to calculate BMI.  Orders:  No orders of the defined types were placed in this encounter.  No orders of the defined types were placed in this encounter.    Procedures: No procedures performed  Clinical Data: No additional findings.  ROS:  All other systems negative, except as noted in the HPI. Review of Systems  Objective: Vital Signs: There were no vitals taken for this visit.  Specialty Comments:  No specialty comments available.  PMFS History: Patient Active Problem List   Diagnosis Date Noted   Acute postoperative pain of right shoulder 06/04/2024   Past Medical History:  Diagnosis Date   Diabetes (HCC)  10/2019    Family History  Problem Relation Age of Onset   Cancer Mother    Hypertension Father     Past Surgical History:  Procedure Laterality Date   INDUCED ABORTION     Social History   Occupational History   Not on file  Tobacco Use   Smoking status: Every Day    Current packs/day: 0.10    Types: Cigarettes   Smokeless tobacco: Never  Vaping Use   Vaping status: Never Used  Substance and Sexual Activity   Alcohol use: Not Currently    Comment: occ   Drug use: Not Currently    Types: Marijuana    Comment: pt denies   Sexual activity: Yes    Birth control/protection: None

## 2024-06-11 ENCOUNTER — Encounter (HOSPITAL_BASED_OUTPATIENT_CLINIC_OR_DEPARTMENT_OTHER): Payer: Self-pay | Admitting: Physical Therapy

## 2024-06-11 ENCOUNTER — Ambulatory Visit (HOSPITAL_BASED_OUTPATIENT_CLINIC_OR_DEPARTMENT_OTHER): Attending: Orthopaedic Surgery | Admitting: Physical Therapy

## 2024-06-11 DIAGNOSIS — M6281 Muscle weakness (generalized): Secondary | ICD-10-CM | POA: Insufficient documentation

## 2024-06-11 DIAGNOSIS — M25511 Pain in right shoulder: Secondary | ICD-10-CM | POA: Insufficient documentation

## 2024-06-11 DIAGNOSIS — M25611 Stiffness of right shoulder, not elsewhere classified: Secondary | ICD-10-CM | POA: Insufficient documentation

## 2024-06-11 NOTE — Therapy (Deleted)
 OUTPATIENT PHYSICAL THERAPY UPPER EXTREMITY TREATMENT  Patient Name: Ashley Stout MRN: [id number] DOB:08-Aug-1991, 33 y.o., female Today's Date: 06/11/2024  END OF SESSION:     Past Medical History:  Diagnosis Date   Diabetes (HCC) 10/2019   Past Surgical History:  Procedure Laterality Date   INDUCED ABORTION     Patient Active Problem List   Diagnosis Date Noted   Acute postoperative pain of right shoulder 06/04/2024    PCP: Patient, No Pcp Per   REFERRING PROVIDER: Genelle Standing, MD    REFERRING DIAG: S43.014D (ICD-10-CM) - Anterior dislocation of right shoulder, subsequent encounter  THERAPY DIAG:  No diagnosis found.  RATIONALE FOR EVALUATION AND TREATMENT: Rehabilitation  ONSET DATE: Surgery 05/17/24  NEXT MD VISIT: 06/04/24   SUBJECTIVE:                                                                                                                                                                                                         SUBJECTIVE STATEMENT: ***  PAIN: Are you having pain? Yes: NPRS scale: 5/10 now; 8/10 worst Pain location: R shoulder Pain description: sore, aching, radiating into mid to distal humerus, sharp at times Aggravating factors: any unsupported positions of the R shoulder Relieving factors: ice, meds  PERTINENT HISTORY:  Diabetes type 1  PRECAUTIONS: Other: labral repair/remplissage protocol  HAND DOMINANCE: Right  RED FLAGS: None  WEIGHT BEARING RESTRICTIONS: Yes NWB RUE during protection phases of recovery  FALLS:  Has patient fallen in last 6 months? 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   FINDINGS: The supraspinatus, infraspinatus, subscapularis, and teres minor tendons are unremarkable. No bursal fluid. No loose bodies. The biceps tendon is intact. There is a complex tear to the anterior/inferior labrum with some displacement anteriorly and inferiorly. Suggestion of some associated periosteal stripping. Findings best appreciated on the axial sequence, series 3. Associated subacute Hill-Sachs deformity. The joint spaces are  unremarkable.   IMPRESSION: Complex tear to the anterior/inferior labrum with mild displacement anteriorly and inferiorly. Suggestion of some associated periosteal stripping.   Subacute appearing Hill-Sachs deformity.   Electronically signed by: Reyes Frees MD 04/19/2024 03:06 PM EDT RP Workstation: [id number]  PATIENT SURVEYS:  Quick Dash=95.5  COGNITION: Overall cognitive status: Within functional limits for tasks assessed     SENSATION: WFL  POSTURE: Forward head, rounded shoulders, decreased shoulder muscle mass/bulk  UPPER EXTREMITY ROM: Not test on evaluation visit due to restrictions  Passive ROM Right eval Left eval  Shoulder flexion    Shoulder extension    Shoulder abduction    Shoulder adduction    Shoulder internal rotation    Shoulder external rotation    Elbow flexion 170 degrees = BUE   Elbow extension    Wrist flexion    Wrist extension    Wrist ulnar deviation    Wrist radial deviation    Wrist pronation    Wrist supination    (Blank rows = not tested)   UPPER EXTREMITY MMT: NOT tested on evaluation visit due to restrictions  MMT Right eval  Left eval  Shoulder flexion    Shoulder extension    Shoulder abduction    Shoulder adduction    Shoulder internal rotation    Shoulder external rotation    Middle trapezius    Lower trapezius    Elbow flexion    Elbow extension    Wrist flexion    Wrist extension    Wrist ulnar deviation    Wrist radial deviation    Wrist pronation    Wrist supination    Grip strength (lbs)    (Blank rows = not tested)  PALPATION:  TTP along the R upper traps and around the R shoulder in general    TODAY'S TREATMENT:  06/04/24 ***    PATIENT EDUCATION:  Education details: PT eval findings, anticipated POC, and initial HEP  Person educated: Patient Education method: Explanation, Demonstration, Verbal cues, Tactile cues, and MedBridgeGO app access provided Education comprehension: verbalized understanding, verbal cues required, tactile cues required, and needs further education  HOME EXERCISE PROGRAM: Access Code: [email or web address] URL: [email or web address]   ASSESSMENT:  CLINICAL IMPRESSION: ***   Ashley Stout is a 33 y.o. female who was referred to physical therapy for evaluation and treatment for Right shoulder recurrent instability and dislocations requiring Right shoulder arthroscopic inferior labral repair with rotator cuff repair/remplissage on 7/10 by Dr Genelle.   Patient is to wear her sling at all times for 4-6 weeks per Dr Danetta written labral repair protocol and this is strongly emphasized to her today.  She is having pain at all times, but worse when her RUE is in unsupported positions.  She is instructed to try sleeping in recliner rather than bed and to keep a pillow under her R arm for support and wear her sling as instructed.  Pain level is as would be expected for her procedure.  Her scope sites all look good with no redness.  Edema is not that severe.  She is not to do any shoulder ROM at this time in her recovery per protocol and this is emphasized to her  today.   Patient is advised to go the pharmacy when she leaves the clinic today and obtain a box of tegaderm with pad dressings and apply them to each of the scope sites.  She is advised not to get the surgery sites wet and no bathing until they  are covered with tegaderm.  Patient has expected deficits in L shoulder ROM and strength although formal ROM/strength Testing is not done today due to restrictions.  She does have pain, forward head/rounded shoulder posture, decreased HEP knowledge, decreased positional/body awareness which are interfering with ADLs and are impacting quality of life.  On QuickDASH patient scored 95.5/100 demonstrating 95.5% disability.  Chai will benefit from skilled PT to address above deficits to improve mobility and activity tolerance with decreased pain interference.She is advised not to do any subluxing motion at all with the R shoulder and to wear her sling all times.   OBJECTIVE IMPAIRMENTS: decreased ROM, decreased strength, impaired flexibility, impaired UE functional use, postural dysfunction, and pain.   ACTIVITY LIMITATIONS: carrying, lifting, bathing, toileting, dressing, self feeding, reach over head, and hygiene/grooming  PARTICIPATION LIMITATIONS: meal prep, cleaning, laundry, driving, and occupation  PERSONAL FACTORS: cost of coming to therapy/high copay are also affecting patient's functional outcome.   REHAB POTENTIAL: Good  CLINICAL DECISION MAKING: Evolving/moderate complexity  EVALUATION COMPLEXITY: Low   GOALS: Goals reviewed with patient? Yes  SHORT TERM GOALS: Target date: 07/02/2024  Patient will be independent with initial HEP to improve outcomes and carryover.  Baseline: 100% PT assist required for completion;  unable to do isometrics correctly Goal status: INITIAL  2.  Patient will report 25% improvement in R shoulder pain to improve QOL.  Baseline: 8/10 worst; 5/10 constant baseline pain Goal status: INITIAL   LONG TERM GOALS:  Target date: 08/13/2024  Patient will be independent with ongoing/advanced HEP for self-management at home.  Baseline: NO advanced HEP given yet  Goal status: INITIAL  2.  Patient will report 50-75% improvement in R shoulder pain to improve QOL.  Baseline: 8/10 worst Goal status: INITIAL  3.  Patient to demonstrate improved upright posture with posterior shoulder girdle engaged to promote improved functional UE use. Baseline: forward head/rounded shoulder w/ poor awareness of posture Goal status: INITIAL  4.  Patient to improve R shoulder  AROM to able to reach overhead (when protocol allows) with good shoulder/scapular positiong without pain provocation to allow for increased ease of ADLs.  Baseline: unable to test on eval due to surgical restrictions Goal status: INITIAL  5.  Patient will demonstrate improved R shoulder strength to >/= 4+/5  (when protocol allows) for functional UE use and to be able to return to work full duty. Baseline: No strength testing due to surgical restrictions Goal status: INITIAL  6  Patient will report </= 50 % on QuickDASH (MCID = 14%) overall more so to demonstrate improved functional ability.  Baseline: 95% Goal status: INITIAL  7.  Patient will be able to lift 20-50 lbs from floor to waist height for return to work   Baseline: NO LIFTING/ No R shoulder ROM yet Goal status: INITIAL    PLAN:  PT FREQUENCY: 1-2x/week  PT DURATION: 12 weeks  PLANNED INTERVENTIONS: 97164- PT Re-evaluation, 97750- Physical Performance Testing, 97110-Therapeutic exercises, 97530- Therapeutic activity, [id number]- Neuromuscular re-education, 97535- Self Care, 02859- Manual therapy, G0283- Electrical stimulation (unattended), 97016- Vasopneumatic device, [id number]- Ultrasound, [id number]- Ionotophoresis 4mg /ml Dexamethasone , 79439 (1-2 muscles), 20561 (3+ muscles)- Dry Needling, Patient/Family education, Taping, Cryotherapy, and Moist heat  PLAN FOR NEXT SESSION: Progress per Dr  Danetta protocol   Rojean JONELLE Batten, PT, DPT 06/11/2024, 7:08 AM

## 2024-06-16 ENCOUNTER — Encounter (HOSPITAL_COMMUNITY): Payer: Self-pay | Admitting: Family Medicine

## 2024-06-16 ENCOUNTER — Inpatient Hospital Stay (HOSPITAL_COMMUNITY)
Admission: AD | Admit: 2024-06-16 | Discharge: 2024-06-16 | Disposition: A | Discharge status: Home / Self Care | Attending: Family Medicine | Admitting: Family Medicine

## 2024-06-16 DIAGNOSIS — B9689 Other specified bacterial agents as the cause of diseases classified elsewhere: Secondary | ICD-10-CM | POA: Diagnosis not present

## 2024-06-16 DIAGNOSIS — N760 Acute vaginitis: Secondary | ICD-10-CM | POA: Diagnosis not present

## 2024-06-16 DIAGNOSIS — R39198 Other difficulties with micturition: Secondary | ICD-10-CM

## 2024-06-16 DIAGNOSIS — Z3202 Encounter for pregnancy test, result negative: Secondary | ICD-10-CM | POA: Diagnosis not present

## 2024-06-16 DIAGNOSIS — R300 Dysuria: Secondary | ICD-10-CM | POA: Diagnosis not present

## 2024-06-16 LAB — URINALYSIS, ROUTINE W REFLEX MICROSCOPIC
Bacteria, UA: NONE SEEN
Bilirubin Urine: NEGATIVE
Glucose, UA: >=500 mg/dL — AB
Hgb urine dipstick: NEGATIVE
Ketones, ur: NEGATIVE mg/dL
Leukocytes,Ua: NEGATIVE
Nitrite: NEGATIVE
Protein, ur: NEGATIVE mg/dL
Specific Gravity, Urine: 1.018 (ref 1.005–1.030)
pH: 6 (ref 5.0–8.0)

## 2024-06-16 LAB — POCT PREGNANCY, URINE: Preg Test, Ur: NEGATIVE

## 2024-06-16 LAB — WET PREP, GENITAL
Sperm: NONE SEEN
Trich, Wet Prep: NONE SEEN
WBC, Wet Prep HPF POC: <10 (ref <10)
Yeast Wet Prep HPF POC: NONE SEEN

## 2024-06-16 MED ORDER — METRONIDAZOLE 0.75 % VA GEL: 1.0000 | Freq: Every day | VAGINAL | 0 refills | Status: DC

## 2024-06-16 MED ORDER — METRONIDAZOLE 500 MG PO TABS: 500.0000 mg | ORAL_TABLET | Freq: Two times a day (BID) | ORAL | 0 refills | Status: AC

## 2024-06-16 NOTE — MAU Provider Note (Signed)
 Chief Complaint: Dysuria  SUBJECTIVE HPI: Ashley Stout is a 33 y.o. G3P0011 with missed period and urinary symptoms.  Patient presents with difficulty in starting urinary stream/discharge. Two weeks ago, patient had cramping and difficulty urinating after intercourse on two separate occasions. Did have increased discharge and itching. Treated empirically for yeast infection with monistat. She has not had a period in >4 weeks and is wondering if she's pregnant. She denies VB, current urinary symptoms, fever/chills, N/V. +Constipation. Known T1DM.  HPI  Past Medical History:  Diagnosis Date   Diabetes (HCC) 10/2019   Past Surgical History:  Procedure Laterality Date   INDUCED ABORTION     Social History   Socioeconomic History   Marital status: Single    Spouse name: Not on file   Number of children: Not on file   Years of education: Not on file   Highest education level: Not on file  Occupational History   Not on file  Tobacco Use   Smoking status: Every Day    Current packs/day: 0.10    Types: Cigarettes   Smokeless tobacco: Never  Vaping Use   Vaping status: Never Used  Substance and Sexual Activity   Alcohol use: Not Currently    Comment: occ   Drug use: Not Currently    Types: Marijuana    Comment: pt denies   Sexual activity: Yes    Birth control/protection: None  Other Topics Concern   Not on file  Social History Narrative   Not on file   Social Drivers of Health   Financial Resource Strain: Not on file  Food Insecurity: Not on file  Transportation Needs: Not on file  Physical Activity: Not on file  Stress: Not on file  Social Connections: Unknown (03/21/2022)   [facility]   Social Network    Social Network: Not on file  Intimate Partner Violence: Unknown (02/11/2022)   Received from [facility]   HITS    Physically Hurt: Not on file    Insult or Talk Down To: Not on file    Threaten Physical Harm: Not on file    Scream or Curse:  Not on file   No current facility-administered medications on file prior to encounter.   Current Outpatient Medications on File Prior to Encounter  Medication Sig Dispense Refill   aspirin  EC 325 MG tablet Take 1 tablet (325 mg total) by mouth daily. 14 tablet 0   aspirin -acetaminophen -caffeine  (EXCEDRIN  MIGRAINE) 250-250-65 MG tablet Take 1 tablet by mouth every 6 (six) hours as needed for headache. 30 tablet 0   Continuous Glucose Sensor (FREESTYLE LIBRE 3 SENSOR) MISC Use as directed to continuously monitor blood sugar. Change every 14 days. 6 each 9   cyclobenzaprine  (FLEXERIL ) 10 MG tablet Take 1 tablet (10 mg total) by mouth 2 (two) times daily as needed for muscle spasms. 20 tablet 0   diphenhydrAMINE  (BENADRYL ) 25 MG tablet Take 1 tablet (25 mg total) by mouth every 8 (eight) hours as needed for itching. 20 tablet 0   fluconazole  (DIFLUCAN ) 150 MG tablet Take 1 tablet (150 mg total) by mouth on days 1, 4 and 7 as directed. 3 tablet 6   insulin  aspart (NOVOLOG ) 100 UNIT/ML FlexPen Max 6 units per meal. Plus correction factor of up to 5 times daily. Max total daily dose: 30 units. 15 mL 9   insulin  degludec (TRESIBA ) 100 UNIT/ML FlexTouch Pen Inject 10 Units into the skin daily. 15 mL 2   Insulin   Degludec (TRESIBA ) 100 UNIT/ML SOLN Inject into the skin.     Insulin  Pen Needle 32G X 4 MM MISC Use as directed every 4 hours 600 each 0   insulin  regular (NOVOLIN R) 100 units/mL injection Inject into the skin 3 (three) times daily before meals.     medroxyPROGESTERone  Acetate 150 MG/ML SUSY Inject 1 mL (150 mg total) into the muscle every 11-12 weeks as directed. 1 mL 3   oxyCODONE  (ROXICODONE ) 5 MG immediate release tablet Take 1 tablet (5 mg total) by mouth every 4 (four) hours as needed for severe pain (pain score 7-10) or breakthrough pain. 10 tablet 0   spironolactone  (ALDACTONE ) 50 MG tablet Take 1 tablet (50 mg total) by mouth daily. 90 tablet 3   triamcinolone  ointment (KENALOG ) 0.1 %  Apply 1 application topically at bedtime. 454 g 2   Allergies  Allergen Reactions   Acetaminophen  Nausea And Vomiting    ROS:  Pertinent positives/negatives listed above.  I have reviewed patient's Past Medical Hx, Surgical Hx, Family Hx, Social Hx, medications and allergies.   Physical Exam  Patient Vitals for the past 24 hrs:  BP Temp Temp src Pulse Resp SpO2  06/16/24 2219 108/79 98.7 F (37.1 C) Oral 67 16 100 %   Constitutional: Well-developed, well-nourished female in no acute distress. Thin Cardiovascular: normal rate Respiratory: normal effort GI: Abd soft, non-tender. Pos BS x 4 MS: Extremities nontender, no edema, normal ROM Neurologic: Alert and oriented x 4.  GU: Neg CVAT  LAB RESULTS Results for orders placed or performed during the hospital encounter of 06/16/24 (from the past 24 hours)  Pregnancy, urine POC     Status: None   Collection Time: 06/16/24 10:05 PM  Result Value Ref Range   Preg Test, Ur NEGATIVE NEGATIVE  Urinalysis, Routine w reflex microscopic -Urine, Clean Catch     Status: Abnormal   Collection Time: 06/16/24 11:08 PM  Result Value Ref Range   Color, Urine YELLOW YELLOW   APPearance CLEAR CLEAR   Specific Gravity, Urine 1.018 1.005 - 1.030   pH 6.0 5.0 - 8.0   Glucose, UA >=500 (A) NEGATIVE mg/dL   Hgb urine dipstick NEGATIVE NEGATIVE   Bilirubin Urine NEGATIVE NEGATIVE   Ketones, ur NEGATIVE NEGATIVE mg/dL   Protein, ur NEGATIVE NEGATIVE mg/dL   Nitrite NEGATIVE NEGATIVE   Leukocytes,Ua NEGATIVE NEGATIVE   RBC / HPF 0-5 0 - 5 RBC/hpf   WBC, UA 6-10 0 - 5 WBC/hpf   Bacteria, UA NONE SEEN NONE SEEN   Squamous Epithelial / HPF 0-5 0 - 5 /HPF  Wet prep, genital     Status: Abnormal   Collection Time: 06/16/24 11:10 PM  Result Value Ref Range   Yeast Wet Prep HPF POC NONE SEEN NONE SEEN   Trich, Wet Prep NONE SEEN NONE SEEN   Clue Cells Wet Prep HPF POC PRESENT (A) NONE SEEN   WBC, Wet Prep HPF POC <10 <10   Sperm NONE SEEN         IMAGING No results found.  MAU Management/MDM: Orders Placed This Encounter  Procedures   Wet prep, genital   Urinalysis, Routine w reflex microscopic -Urine, Clean Catch   Pregnancy, urine POC   Discharge patient    Meds ordered this encounter  Medications   DISCONTD: metroNIDAZOLE  (METROGEL ) 0.75 % vaginal gel    Sig: Place 1 Applicatorful vaginally at bedtime.    Dispense:  70 g    Refill:  0  metroNIDAZOLE  (FLAGYL ) 500 MG tablet    Sig: Take 1 tablet (500 mg total) by mouth 2 (two) times daily.    Dispense:  14 tablet    Refill:  0    Patient presents with missed period and urinary/discharge symptoms with known T1DM. UPT negative. UA ruled-out UTI. Wet prep did return positive for clue cells. Will treat for BV with flagyl . Provided with list of OB/GYN providers in the area as hers has recently relocated.  ASSESSMENT 1. Bacterial vaginosis   2. Pregnancy test negative     PLAN Discharge home with strict return precautions. Allergies as of 06/16/2024       Reactions   Acetaminophen  Nausea And Vomiting        Medication List     TAKE these medications    aspirin  EC 325 MG tablet Take 1 tablet (325 mg total) by mouth daily.   aspirin -acetaminophen -caffeine  250-250-65 MG tablet Commonly known as: Excedrin  Migraine Take 1 tablet by mouth every 6 (six) hours as needed for headache.   cyclobenzaprine  10 MG tablet Commonly known as: FLEXERIL  Take 1 tablet (10 mg total) by mouth 2 (two) times daily as needed for muscle spasms.   diphenhydrAMINE  25 MG tablet Commonly known as: BENADRYL  Take 1 tablet (25 mg total) by mouth every 8 (eight) hours as needed for itching.   fluconazole  150 MG tablet Commonly known as: DIFLUCAN  Take 1 tablet (150 mg total) by mouth on days 1, 4 and 7 as directed.   FreeStyle Libre 3 Sensor Misc Use as directed to continuously monitor blood sugar. Change every 14 days.   insulin  aspart 100 UNIT/ML FlexPen Commonly known as:  NOVOLOG  Max 6 units per meal. Plus correction factor of up to 5 times daily. Max total daily dose: 30 units.   Insulin  Pen Needle 32G X 4 MM Misc Use as directed every 4 hours   insulin  regular 100 units/mL injection Commonly known as: NOVOLIN R Inject into the skin 3 (three) times daily before meals.   medroxyPROGESTERone  Acetate 150 MG/ML Susy Inject 1 mL (150 mg total) into the muscle every 11-12 weeks as directed.   metroNIDAZOLE  500 MG tablet Commonly known as: FLAGYL  Take 1 tablet (500 mg total) by mouth 2 (two) times daily.   oxyCODONE  5 MG immediate release tablet Commonly known as: Roxicodone  Take 1 tablet (5 mg total) by mouth every 4 (four) hours as needed for severe pain (pain score 7-10) or breakthrough pain.   spironolactone  50 MG tablet Commonly known as: ALDACTONE  Take 1 tablet (50 mg total) by mouth daily.   Tresiba  100 UNIT/ML Soln Generic drug: Insulin  Degludec Inject into the skin.   insulin  degludec 100 UNIT/ML FlexTouch Pen Commonly known as: TRESIBA  Inject 10 Units into the skin daily.   triamcinolone  ointment 0.1 % Commonly known as: KENALOG  Apply 1 application topically at bedtime.         Almarie Moats, MD OB Fellow 06/17/2024  12:47 AM

## 2024-06-16 NOTE — MAU Note (Signed)
 Ashley Stout is a 33 y.o. at Unknown here in MAU reporting: type 1 diabetic. Reports thinking she had a yeast infection and used monistat 1 day 2 weeks ago. Reports having sex twice with cramping feeling afterwards - couldn't urinate like normal afterwards and now concerned she has a UTI. Does not believe she is pregnant. Denies VB or abnormal discharge. States she does not have current problems with urination - it was just those 2 times after intercourse   LMP: 05/15/2024 - estimated Onset of complaint: 2 weeks  Pain score: 0 Vitals:   06/16/24 2219  BP: 108/79  Pulse: 67  Resp: 16  Temp: 98.7 F (37.1 C)  SpO2: 100%     FHT: NA  Lab orders placed from triage: urine pregnancy

## 2024-06-16 NOTE — Discharge Instructions (Signed)

## 2024-06-18 ENCOUNTER — Encounter (HOSPITAL_BASED_OUTPATIENT_CLINIC_OR_DEPARTMENT_OTHER): Admitting: Physical Therapy

## 2024-06-18 LAB — GC/CHLAMYDIA PROBE AMP (~~LOC~~) NOT AT ARMC
Chlamydia: NEGATIVE
Comment: NEGATIVE
Comment: NORMAL
Neisseria Gonorrhea: NEGATIVE

## 2024-06-19 ENCOUNTER — Ambulatory Visit (HOSPITAL_BASED_OUTPATIENT_CLINIC_OR_DEPARTMENT_OTHER)

## 2024-06-20 ENCOUNTER — Encounter (HOSPITAL_BASED_OUTPATIENT_CLINIC_OR_DEPARTMENT_OTHER): Admitting: Physical Therapy

## 2024-06-22 DIAGNOSIS — B3731 Acute candidiasis of vulva and vagina: Secondary | ICD-10-CM | POA: Diagnosis not present

## 2024-06-22 DIAGNOSIS — Z124 Encounter for screening for malignant neoplasm of cervix: Secondary | ICD-10-CM | POA: Diagnosis not present

## 2024-06-22 DIAGNOSIS — E1065 Type 1 diabetes mellitus with hyperglycemia: Secondary | ICD-10-CM | POA: Diagnosis not present

## 2024-06-22 DIAGNOSIS — R0602 Shortness of breath: Secondary | ICD-10-CM | POA: Diagnosis not present

## 2024-06-22 DIAGNOSIS — Z113 Encounter for screening for infections with a predominantly sexual mode of transmission: Secondary | ICD-10-CM | POA: Diagnosis not present

## 2024-06-22 DIAGNOSIS — L709 Acne, unspecified: Secondary | ICD-10-CM | POA: Diagnosis not present

## 2024-06-22 DIAGNOSIS — F17210 Nicotine dependence, cigarettes, uncomplicated: Secondary | ICD-10-CM | POA: Diagnosis not present

## 2024-06-22 DIAGNOSIS — Z0001 Encounter for general adult medical examination with abnormal findings: Secondary | ICD-10-CM | POA: Diagnosis not present

## 2024-06-22 DIAGNOSIS — Z23 Encounter for immunization: Secondary | ICD-10-CM | POA: Diagnosis not present

## 2024-06-25 ENCOUNTER — Encounter (HOSPITAL_BASED_OUTPATIENT_CLINIC_OR_DEPARTMENT_OTHER): Admitting: Physical Therapy

## 2024-06-25 ENCOUNTER — Encounter (HOSPITAL_BASED_OUTPATIENT_CLINIC_OR_DEPARTMENT_OTHER)

## 2024-06-27 ENCOUNTER — Telehealth: Payer: Self-pay | Admitting: Orthopaedic Surgery

## 2024-06-27 ENCOUNTER — Encounter (HOSPITAL_BASED_OUTPATIENT_CLINIC_OR_DEPARTMENT_OTHER): Payer: Self-pay | Admitting: Orthopaedic Surgery

## 2024-06-27 NOTE — Telephone Encounter (Signed)
 Patient called and said that she needs an  extension for work because she can't use her arm and can't take of brace for two months. CB#[phone]

## 2024-06-27 NOTE — Telephone Encounter (Signed)
 Patient called and said she needs a lite duty and restrictions letter for work. She needs it to be specific about the details please. CB# [phone]

## 2024-06-28 ENCOUNTER — Encounter (HOSPITAL_BASED_OUTPATIENT_CLINIC_OR_DEPARTMENT_OTHER): Admitting: Physical Therapy

## 2024-06-28 ENCOUNTER — Encounter (HOSPITAL_BASED_OUTPATIENT_CLINIC_OR_DEPARTMENT_OTHER): Payer: Self-pay | Admitting: Orthopaedic Surgery

## 2024-07-11 ENCOUNTER — Ambulatory Visit (INDEPENDENT_AMBULATORY_CARE_PROVIDER_SITE_OTHER): Admitting: Orthopaedic Surgery

## 2024-07-11 DIAGNOSIS — G8918 Other acute postprocedural pain: Secondary | ICD-10-CM

## 2024-07-11 DIAGNOSIS — M25511 Pain in right shoulder: Secondary | ICD-10-CM

## 2024-07-11 NOTE — Progress Notes (Signed)
 Post Operative Evaluation    Procedure/Date of Surgery: Right shoulder labral repair with remplissage 7/10  Interval History:   Presents today for follow-up 2 weeks status post the above procedure.  Overall she is doing extremely well with excellent restoration of stability   PMH/PSH/Family History/Social History/Meds/Allergies:    Past Medical History:  Diagnosis Date   Diabetes (HCC) 10/2019   Past Surgical History:  Procedure Laterality Date   INDUCED ABORTION     Social History   Socioeconomic History   Marital status: Single    Spouse name: Not on file   Number of children: Not on file   Years of education: Not on file   Highest education level: Not on file  Occupational History   Not on file  Tobacco Use   Smoking status: Every Day    Current packs/day: 0.10    Types: Cigarettes   Smokeless tobacco: Never  Vaping Use   Vaping status: Never Used  Substance and Sexual Activity   Alcohol use: Not Currently    Comment: occ   Drug use: Not Currently    Types: Marijuana    Comment: pt denies   Sexual activity: Yes    Birth control/protection: None  Other Topics Concern   Not on file  Social History Narrative   Not on file   Social Drivers of Health   Financial Resource Strain: Low Risk  (06/20/2024)   Received from [facility]   Overall Financial Resource Strain (CARDIA)    How hard is it for you to pay for the very basics like food, housing, medical care, and heating?: Not hard at all  Food Insecurity: No Food Insecurity (06/20/2024)   Received from [facility]   Hunger Vital Sign    Within the past 12 months, you worried that your food would run out before you got the money to buy more.: Never true    Within the past 12 months, the food you bought just didn't last and you didn't have money to get more.: Never true  Transportation Needs: Unmet Transportation Needs (06/20/2024)   Received from [facility]  - Transportation    In the past 12 months, has lack of transportation kept you from medical appointments or from getting medications?: No    In the past 12 months, has lack of transportation kept you from meetings, work, or from getting things needed for daily living?: Yes  Physical Activity: Sufficiently Active (06/20/2024)   Received from [facility]   Exercise Vital Sign    On average, how many days per week do you engage in moderate to strenuous exercise (like a brisk walk)?: 3 days    On average, how many minutes do you engage in exercise at this level?: 60 min  Stress: No Stress Concern Present (06/20/2024)   Received from [facility] - Occupational Stress Questionnaire    Do you feel stress - tense, restless, nervous, or anxious, or unable to sleep at night because your mind is troubled all the time - these days?: Only a little  Social Connections: Socially Integrated (06/20/2024)   Received from [facility]   Social Network    How would you rate your social network (family, work, friends)?: Good participation with social networks  Family History  Problem Relation Age of Onset   Cancer Mother    Hypertension Father    Allergies  Allergen Reactions   Acetaminophen  Nausea And Vomiting   Current Outpatient Medications  Medication Sig Dispense Refill   aspirin  EC 325 MG tablet Take 1 tablet (325 mg total) by mouth daily. 14 tablet 0   aspirin -acetaminophen -caffeine  (EXCEDRIN  MIGRAINE) 250-250-65 MG tablet Take 1 tablet by mouth every 6 (six) hours as needed for headache. 30 tablet 0   Continuous Glucose Sensor (FREESTYLE LIBRE 3 SENSOR) MISC Use as directed to continuously monitor blood sugar. Change every 14 days. 6 each 9   cyclobenzaprine  (FLEXERIL ) 10 MG tablet Take 1 tablet (10 mg total) by mouth 2 (two) times daily as needed for muscle spasms. 20 tablet 0   diphenhydrAMINE  (BENADRYL ) 25 MG tablet Take 1 tablet (25 mg total) by  mouth every 8 (eight) hours as needed for itching. 20 tablet 0   fluconazole  (DIFLUCAN ) 150 MG tablet Take 1 tablet (150 mg total) by mouth on days 1, 4 and 7 as directed. 3 tablet 6   insulin  aspart (NOVOLOG ) 100 UNIT/ML FlexPen Max 6 units per meal. Plus correction factor of up to 5 times daily. Max total daily dose: 30 units. 15 mL 9   insulin  degludec (TRESIBA ) 100 UNIT/ML FlexTouch Pen Inject 10 Units into the skin daily. 15 mL 2   Insulin  Degludec (TRESIBA ) 100 UNIT/ML SOLN Inject into the skin.     Insulin  Pen Needle 32G X 4 MM MISC Use as directed every 4 hours 600 each 0   insulin  regular (NOVOLIN R) 100 units/mL injection Inject into the skin 3 (three) times daily before meals.     medroxyPROGESTERone  Acetate 150 MG/ML SUSY Inject 1 mL (150 mg total) into the muscle every 11-12 weeks as directed. 1 mL 3   metroNIDAZOLE  (FLAGYL ) 500 MG tablet Take 1 tablet (500 mg total) by mouth 2 (two) times daily. 14 tablet 0   oxyCODONE  (ROXICODONE ) 5 MG immediate release tablet Take 1 tablet (5 mg total) by mouth every 4 (four) hours as needed for severe pain (pain score 7-10) or breakthrough pain. 10 tablet 0   spironolactone  (ALDACTONE ) 50 MG tablet Take 1 tablet (50 mg total) by mouth daily. 90 tablet 3   triamcinolone  ointment (KENALOG ) 0.1 % Apply 1 application topically at bedtime. 454 g 2   No current facility-administered medications for this visit.   No results found.  Review of Systems:   A ROS was performed including pertinent positives and negatives as documented in the HPI.   Musculoskeletal Exam:    There were no vitals taken for this visit.  Right shoulder with active forward elevation 150 degrees external rotation at the side is 45 compared to 80 on the left internal rotation is to L1 compared to T12 on the left  Imaging:      I personally reviewed and interpreted the radiographs.   Assessment:   Status post right shoulder arthroscopic labral repair with remplissage  overall doing extremely well.  At this time she will continue to work on range of motion and strengthening and I will plan to see her back in 1 month for possible clearance to full duty  Plan :    - Return to clinic 1 month      I personally saw and evaluated the patient, and participated in the management and treatment plan.  Elspeth Parker, MD Attending Physician, Orthopedic Surgery  This document was dictated  using Conservation officer, historic buildings. A reasonable attempt at proof reading has been made to minimize errors.

## 2024-08-07 ENCOUNTER — Encounter (HOSPITAL_BASED_OUTPATIENT_CLINIC_OR_DEPARTMENT_OTHER): Payer: Self-pay | Admitting: Orthopaedic Surgery

## 2024-08-07 ENCOUNTER — Telehealth (HOSPITAL_BASED_OUTPATIENT_CLINIC_OR_DEPARTMENT_OTHER): Payer: Self-pay | Admitting: Orthopaedic Surgery

## 2024-08-07 NOTE — Telephone Encounter (Signed)
 Pt portal message sent to Dr B to ok this note

## 2024-08-07 NOTE — Telephone Encounter (Signed)
 Patient states she was in pain from her shoulder form work because they have lifting more than she is supposed to and she has been off since sat and can not go back unitl she gets a Dr Note. Please advise

## 2024-09-03 DIAGNOSIS — E1065 Type 1 diabetes mellitus with hyperglycemia: Secondary | ICD-10-CM | POA: Diagnosis not present

## 2024-09-03 DIAGNOSIS — Z978 Presence of other specified devices: Secondary | ICD-10-CM | POA: Diagnosis not present

## 2024-09-06 ENCOUNTER — Encounter (HOSPITAL_BASED_OUTPATIENT_CLINIC_OR_DEPARTMENT_OTHER): Payer: Self-pay | Admitting: Orthopaedic Surgery

## 2024-09-06 ENCOUNTER — Ambulatory Visit (INDEPENDENT_AMBULATORY_CARE_PROVIDER_SITE_OTHER): Admitting: Orthopaedic Surgery

## 2024-09-06 DIAGNOSIS — M25511 Pain in right shoulder: Secondary | ICD-10-CM

## 2024-09-06 DIAGNOSIS — G8918 Other acute postprocedural pain: Secondary | ICD-10-CM

## 2024-09-06 NOTE — Progress Notes (Signed)
 Post Operative Evaluation    Procedure/Date of Surgery: Right shoulder labral repair with remplissage 7/10  Interval History:   Presents today for follow-up 26-month status post above procedure.  Overall Ashley Stout is doing very well.  The shoulder does feel much more solid although Ashley Stout is still having some weakness with overhead lifting   PMH/PSH/Family History/Social History/Meds/Allergies:    Past Medical History:  Diagnosis Date   Diabetes (HCC) 10/2019   Past Surgical History:  Procedure Laterality Date   INDUCED ABORTION     Social History   Socioeconomic History   Marital status: Single    Spouse name: Not on file   Number of children: Not on file   Years of education: Not on file   Highest education level: Not on file  Occupational History   Not on file  Tobacco Use   Smoking status: Every Day    Current packs/day: 0.10    Types: Cigarettes   Smokeless tobacco: Never  Vaping Use   Vaping status: Never Used  Substance and Sexual Activity   Alcohol use: Not Currently    Comment: occ   Drug use: Not Currently    Types: Marijuana    Comment: pt denies   Sexual activity: Yes    Birth control/protection: None  Other Topics Concern   Not on file  Social History Narrative   Not on file   Social Drivers of Health   Financial Resource Strain: Low Risk  (06/20/2024)   Received from [facility]   Overall Financial Resource Strain (CARDIA)    How hard is it for you to pay for the very basics like food, housing, medical care, and heating?: Not hard at all  Food Insecurity: No Food Insecurity (06/20/2024)   Received from [facility]   Hunger Vital Sign    Within the past 12 months, you worried that your food would run out before you got the money to buy more.: Never true    Within the past 12 months, the food you bought just didn't last and you didn't have money to get more.: Never true  Transportation Needs: Unmet Transportation  Needs (06/20/2024)   Received from [facility] - Transportation    In the past 12 months, has lack of transportation kept you from medical appointments or from getting medications?: No    In the past 12 months, has lack of transportation kept you from meetings, work, or from getting things needed for daily living?: Yes  Physical Activity: Sufficiently Active (06/20/2024)   Received from [facility]   Exercise Vital Sign    On average, how many days per week do you engage in moderate to strenuous exercise (like a brisk walk)?: 3 days    On average, how many minutes do you engage in exercise at this level?: 60 min  Stress: No Stress Concern Present (06/20/2024)   Received from [facility]ational Health - Occupational Stress Questionnaire    Do you feel stress - tense, restless, nervous, or anxious, or unable to sleep at night because your mind is troubled all the time - these days?: Only a little  Social Connections: Socially Integrated (06/20/2024)   Received from [facility]   Social Network    How would you rate your social  network (family, work, friends)?: Good participation with social networks   Family History  Problem Relation Age of Onset   Cancer Mother    Hypertension Father    Allergies  Allergen Reactions   Acetaminophen  Nausea And Vomiting   Current Outpatient Medications  Medication Sig Dispense Refill   aspirin  EC 325 MG tablet Take 1 tablet (325 mg total) by mouth daily. 14 tablet 0   aspirin -acetaminophen -caffeine  (EXCEDRIN  MIGRAINE) 250-250-65 MG tablet Take 1 tablet by mouth every 6 (six) hours as needed for headache. 30 tablet 0   Continuous Glucose Sensor (FREESTYLE LIBRE 3 SENSOR) MISC Use as directed to continuously monitor blood sugar. Change every 14 days. 6 each 9   cyclobenzaprine  (FLEXERIL ) 10 MG tablet Take 1 tablet (10 mg total) by mouth 2 (two) times daily as needed for muscle spasms. 20 tablet 0    diphenhydrAMINE  (BENADRYL ) 25 MG tablet Take 1 tablet (25 mg total) by mouth every 8 (eight) hours as needed for itching. 20 tablet 0   fluconazole  (DIFLUCAN ) 150 MG tablet Take 1 tablet (150 mg total) by mouth on days 1, 4 and 7 as directed. 3 tablet 6   insulin  aspart (NOVOLOG ) 100 UNIT/ML FlexPen Max 6 units per meal. Plus correction factor of up to 5 times daily. Max total daily dose: 30 units. 15 mL 9   insulin  degludec (TRESIBA ) 100 UNIT/ML FlexTouch Pen Inject 10 Units into the skin daily. 15 mL 2   Insulin  Degludec (TRESIBA ) 100 UNIT/ML SOLN Inject into the skin.     Insulin  Pen Needle 32G X 4 MM MISC Use as directed every 4 hours 600 each 0   insulin  regular (NOVOLIN R) 100 units/mL injection Inject into the skin 3 (three) times daily before meals.     medroxyPROGESTERone  Acetate 150 MG/ML SUSY Inject 1 mL (150 mg total) into the muscle every 11-12 weeks as directed. 1 mL 3   metroNIDAZOLE  (FLAGYL ) 500 MG tablet Take 1 tablet (500 mg total) by mouth 2 (two) times daily. 14 tablet 0   oxyCODONE  (ROXICODONE ) 5 MG immediate release tablet Take 1 tablet (5 mg total) by mouth every 4 (four) hours as needed for severe pain (pain score 7-10) or breakthrough pain. 10 tablet 0   spironolactone  (ALDACTONE ) 50 MG tablet Take 1 tablet (50 mg total) by mouth daily. 90 tablet 3   triamcinolone  ointment (KENALOG ) 0.1 % Apply 1 application topically at bedtime. 454 g 2   No current facility-administered medications for this visit.   No results found.  Review of Systems:   A ROS was performed including pertinent positives and negatives as documented in the HPI.   Musculoskeletal Exam:    There were no vitals taken for this visit.  Right shoulder with active forward elevation 150 degrees external rotation at the side is 45 compared to 80 on the left internal rotation is to L1 compared to T12 on the left  Imaging:      I personally reviewed and interpreted the radiographs.   Assessment:    Status post right shoulder arthroscopic labral repair with remplissage overall doing extremely well.  At this time Ashley Stout may participate in light duty for an additional 8 weeks and we will plan for full return after that.  Plan :    - Return to clinic as needed      I personally saw and evaluated the patient, and participated in the management and treatment plan.  Elspeth Parker, MD Attending Physician, Orthopedic Surgery  This  document was dictated using Conservation officer, historic buildings. A reasonable attempt at proof reading has been made to minimize errors.

## 2024-09-10 ENCOUNTER — Encounter: Payer: Self-pay | Admitting: Radiology

## 2024-10-17 DIAGNOSIS — R5381 Other malaise: Secondary | ICD-10-CM | POA: Diagnosis not present

## 2024-10-17 DIAGNOSIS — E049 Nontoxic goiter, unspecified: Secondary | ICD-10-CM | POA: Diagnosis not present

## 2024-10-17 DIAGNOSIS — Z113 Encounter for screening for infections with a predominantly sexual mode of transmission: Secondary | ICD-10-CM | POA: Diagnosis not present

## 2024-10-17 DIAGNOSIS — E1065 Type 1 diabetes mellitus with hyperglycemia: Secondary | ICD-10-CM | POA: Diagnosis not present

## 2024-10-17 DIAGNOSIS — R5383 Other fatigue: Secondary | ICD-10-CM | POA: Diagnosis not present

## 2024-10-17 DIAGNOSIS — R630 Anorexia: Secondary | ICD-10-CM | POA: Diagnosis not present

## 2024-10-18 ENCOUNTER — Other Ambulatory Visit: Payer: Self-pay | Admitting: Internal Medicine

## 2024-10-18 DIAGNOSIS — M9908 Segmental and somatic dysfunction of rib cage: Secondary | ICD-10-CM | POA: Diagnosis not present

## 2024-10-18 DIAGNOSIS — E049 Nontoxic goiter, unspecified: Secondary | ICD-10-CM

## 2024-10-18 DIAGNOSIS — M9902 Segmental and somatic dysfunction of thoracic region: Secondary | ICD-10-CM | POA: Diagnosis not present

## 2024-10-18 DIAGNOSIS — M9901 Segmental and somatic dysfunction of cervical region: Secondary | ICD-10-CM | POA: Diagnosis not present

## 2024-10-18 DIAGNOSIS — M62830 Muscle spasm of back: Secondary | ICD-10-CM | POA: Diagnosis not present

## 2024-10-18 DIAGNOSIS — M531 Cervicobrachial syndrome: Secondary | ICD-10-CM | POA: Diagnosis not present

## 2024-10-19 DIAGNOSIS — E1065 Type 1 diabetes mellitus with hyperglycemia: Secondary | ICD-10-CM | POA: Diagnosis not present

## 2024-10-19 DIAGNOSIS — F321 Major depressive disorder, single episode, moderate: Secondary | ICD-10-CM | POA: Diagnosis not present

## 2024-10-19 DIAGNOSIS — R5383 Other fatigue: Secondary | ICD-10-CM | POA: Diagnosis not present

## 2024-10-25 ENCOUNTER — Inpatient Hospital Stay: Admission: RE | Admit: 2024-10-25 | Discharge: 2024-10-25 | Attending: Internal Medicine | Admitting: Internal Medicine

## 2024-10-25 DIAGNOSIS — E041 Nontoxic single thyroid nodule: Secondary | ICD-10-CM | POA: Diagnosis not present

## 2024-10-25 DIAGNOSIS — E049 Nontoxic goiter, unspecified: Secondary | ICD-10-CM

## 2024-11-20 ENCOUNTER — Encounter (HOSPITAL_BASED_OUTPATIENT_CLINIC_OR_DEPARTMENT_OTHER): Payer: Self-pay | Admitting: Orthopaedic Surgery

## 2024-12-04 ENCOUNTER — Emergency Department (HOSPITAL_COMMUNITY)
Admission: EM | Admit: 2024-12-04 | Discharge: 2024-12-04 | Disposition: A | Discharge status: Home / Self Care | Attending: Emergency Medicine | Admitting: Emergency Medicine

## 2024-12-04 ENCOUNTER — Encounter (HOSPITAL_BASED_OUTPATIENT_CLINIC_OR_DEPARTMENT_OTHER): Payer: Self-pay | Admitting: Orthopaedic Surgery

## 2024-12-04 ENCOUNTER — Emergency Department (HOSPITAL_COMMUNITY)

## 2024-12-04 ENCOUNTER — Other Ambulatory Visit: Payer: Self-pay

## 2024-12-04 DIAGNOSIS — S0081XA Abrasion of other part of head, initial encounter: Secondary | ICD-10-CM | POA: Diagnosis not present

## 2024-12-04 DIAGNOSIS — R519 Headache, unspecified: Secondary | ICD-10-CM | POA: Diagnosis not present

## 2024-12-04 DIAGNOSIS — S60512A Abrasion of left hand, initial encounter: Secondary | ICD-10-CM | POA: Diagnosis not present

## 2024-12-04 DIAGNOSIS — Z794 Long term (current) use of insulin: Secondary | ICD-10-CM | POA: Insufficient documentation

## 2024-12-04 DIAGNOSIS — E109 Type 1 diabetes mellitus without complications: Secondary | ICD-10-CM | POA: Diagnosis not present

## 2024-12-04 DIAGNOSIS — S0993XA Unspecified injury of face, initial encounter: Secondary | ICD-10-CM | POA: Diagnosis present

## 2024-12-04 DIAGNOSIS — S80212A Abrasion, left knee, initial encounter: Secondary | ICD-10-CM | POA: Diagnosis not present

## 2024-12-04 DIAGNOSIS — Y92410 Unspecified street and highway as the place of occurrence of the external cause: Secondary | ICD-10-CM | POA: Diagnosis not present

## 2024-12-04 LAB — CBG MONITORING, ED: Glucose-Capillary: 361 mg/dL — ABNORMAL HIGH (ref 70–99)

## 2024-12-04 MED ORDER — METHOCARBAMOL 500 MG PO TABS: 500.0000 mg | ORAL_TABLET | Freq: Two times a day (BID) | ORAL | 0 refills | Status: AC

## 2024-12-04 MED ORDER — BACITRACIN ZINC 500 UNIT/GM EX OINT
TOPICAL_OINTMENT | Freq: Two times a day (BID) | CUTANEOUS | Status: DC
  Administered 2024-12-04: 2 via TOPICAL
  Filled 2024-12-04: qty 1.8

## 2024-12-04 MED ORDER — LIDOCAINE 5 % EX PTCH: 1.0000 | MEDICATED_PATCH | CUTANEOUS | 0 refills | Status: AC

## 2024-12-04 MED ORDER — BACITRACIN ZINC 500 UNIT/GM EX OINT: 1.0000 | TOPICAL_OINTMENT | Freq: Two times a day (BID) | CUTANEOUS | 0 refills | Status: AC

## 2024-12-04 MED ORDER — NAPROXEN 500 MG PO TABS: 500.0000 mg | ORAL_TABLET | Freq: Two times a day (BID) | ORAL | 0 refills | Status: AC

## 2024-12-04 NOTE — ED Notes (Signed)
 Pt requested water and stated her CGM is reading HI. Pt stated she is a type 1 diabetic and she hadn't had anything to eat or drink recently. Pt very concerned about a CT scan that had not been first. It was explained to her that she needed to be seen by the provider first and she had not been seen d/t pt acuity in the department.
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
## 2024-12-05 ENCOUNTER — Ambulatory Visit (INDEPENDENT_AMBULATORY_CARE_PROVIDER_SITE_OTHER): Admitting: Student

## 2024-12-05 DIAGNOSIS — M25511 Pain in right shoulder: Secondary | ICD-10-CM | POA: Diagnosis not present

## 2024-12-05 DIAGNOSIS — M25562 Pain in left knee: Secondary | ICD-10-CM | POA: Diagnosis not present
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
